# Patient Record
Sex: Female | Born: 1955 | Race: White | Hispanic: No | Marital: Married | Smoking: Never smoker
Health system: Southern US, Community
[De-identification: ages and names within clinical notes are randomized; demographics above are authoritative.]

## PROBLEM LIST (undated history)

## (undated) DIAGNOSIS — Z780 Asymptomatic menopausal state: Secondary | ICD-10-CM

## (undated) DIAGNOSIS — M549 Dorsalgia, unspecified: Secondary | ICD-10-CM

## (undated) DIAGNOSIS — K219 Gastro-esophageal reflux disease without esophagitis: Secondary | ICD-10-CM

## (undated) DIAGNOSIS — M542 Cervicalgia: Secondary | ICD-10-CM

## (undated) HISTORY — PX: BACK SURGERY: SHX140

## (undated) HISTORY — DX: Asymptomatic menopausal state: Z78.0

## (undated) HISTORY — DX: Dorsalgia, unspecified: M54.9

## (undated) HISTORY — PX: BREAST EXCISIONAL BIOPSY: SUR124

## (undated) HISTORY — DX: Gastro-esophageal reflux disease without esophagitis: K21.9

## (undated) HISTORY — PX: DG 4TH DIGIT  LEFT HAND: HXRAD1648

## (undated) HISTORY — PX: POSTERIOR CERVICAL FUSION/FORAMINOTOMY: SHX5038

## (undated) HISTORY — DX: Cervicalgia: M54.2

---

## 1991-11-09 HISTORY — PX: BREAST SURGERY: SHX581

## 1994-11-08 HISTORY — PX: UMBILICAL HERNIA REPAIR: SHX196

## 1997-11-08 HISTORY — PX: BACK SURGERY: SHX140

## 2007-11-09 HISTORY — PX: ENDOMETRIAL ABLATION: SHX621

## 2014-03-13 ENCOUNTER — Encounter: Payer: Self-pay | Admitting: Internal Medicine

## 2014-03-13 ENCOUNTER — Ambulatory Visit (INDEPENDENT_AMBULATORY_CARE_PROVIDER_SITE_OTHER): Payer: 59 | Admitting: Internal Medicine

## 2014-03-13 VITALS — BP 116/70 | HR 67 | Temp 98.1°F | Resp 18 | Wt 186.0 lb

## 2014-03-13 DIAGNOSIS — Z780 Asymptomatic menopausal state: Secondary | ICD-10-CM

## 2014-03-13 DIAGNOSIS — M5416 Radiculopathy, lumbar region: Secondary | ICD-10-CM

## 2014-03-13 DIAGNOSIS — N951 Menopausal and female climacteric states: Secondary | ICD-10-CM

## 2014-03-13 DIAGNOSIS — M5410 Radiculopathy, site unspecified: Secondary | ICD-10-CM

## 2014-03-13 DIAGNOSIS — N840 Polyp of corpus uteri: Secondary | ICD-10-CM

## 2014-03-13 DIAGNOSIS — G894 Chronic pain syndrome: Secondary | ICD-10-CM

## 2014-03-13 DIAGNOSIS — IMO0002 Reserved for concepts with insufficient information to code with codable children: Secondary | ICD-10-CM

## 2014-03-13 DIAGNOSIS — Z803 Family history of malignant neoplasm of breast: Secondary | ICD-10-CM

## 2014-03-13 DIAGNOSIS — K219 Gastro-esophageal reflux disease without esophagitis: Secondary | ICD-10-CM

## 2014-03-13 DIAGNOSIS — M5412 Radiculopathy, cervical region: Secondary | ICD-10-CM

## 2014-03-13 DIAGNOSIS — Z8041 Family history of malignant neoplasm of ovary: Secondary | ICD-10-CM

## 2014-03-13 DIAGNOSIS — D242 Benign neoplasm of left breast: Secondary | ICD-10-CM

## 2014-03-13 DIAGNOSIS — N952 Postmenopausal atrophic vaginitis: Secondary | ICD-10-CM

## 2014-03-13 DIAGNOSIS — M4712 Other spondylosis with myelopathy, cervical region: Secondary | ICD-10-CM

## 2014-03-13 DIAGNOSIS — N6009 Solitary cyst of unspecified breast: Secondary | ICD-10-CM

## 2014-03-13 DIAGNOSIS — Z808 Family history of malignant neoplasm of other organs or systems: Secondary | ICD-10-CM

## 2014-03-13 DIAGNOSIS — D249 Benign neoplasm of unspecified breast: Secondary | ICD-10-CM

## 2014-03-13 MED ORDER — ZOLPIDEM TARTRATE 5 MG PO TABS: 5.0000 mg | ORAL_TABLET | Freq: Every evening | ORAL | Status: DC | PRN

## 2014-03-13 NOTE — Patient Instructions (Signed)
Schedule CPE  Will rfer to Dr. Loretta Plume  Schedule 3d mm at [facility] hospital

## 2014-03-13 NOTE — Progress Notes (Signed)
Subjective:    Patient ID: Kristen Durham, female    DOB: 07/06/1956, 58 y.o.   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  Her husband took a job as a school principal.    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  She was under the care of [facility] Dr. Valma Cava.    Per neurology records she does have weakness of her  LUE ,  Has chronic pain  (managed currently without narcotics) and intermittant and recurrent  myelopathic and radiculopathic symptoms.     She describes that initial injury  Was in 1999 that she believes may have been sustained when she finished a long surgical case.  She had  HNP and  underwent a C6-7 formainotomy and laminotomy.  At some point she became myelopathic with temporary LE paralysis.  She underwent discectomy and fusion of C6-7.   She has also had fusion of L5-S1.  Other PMH includes a thickened endometrium,   S/P cryotherapy of endometrial polyps  In 2009 , GERD on Pepcid and abnormal MM Felt to be due to fibrocystic disease    FH premenopausal breast CA in [place]  She has discomfort at night but currently is controlled without narcotics. She would like to establish with a neurologist here in [place].  Ambien helps with her sleep issues.  She has had a cyst in her Right breast that needs to be followed serially.    She uses Estrace vaginal creme for her atrophy.    No Known Allergies Past Medical History  Diagnosis Date  . GERD (gastroesophageal reflux disease)   . Menopause   . Back pain   . Neck pain    Past Surgical History  Procedure Laterality Date  . Back surgery  1999    cerival laminectomy  . Back surgery    . Posterior cervical fusion/foraminotomy    . Breast surgery      breast biopsy    History   Social History  . Marital Status: Married    Spouse Name: N/A   Number of Children: N/A  . Years of Education: N/A   Occupational History  . Not on file.   Social History Main Topics  . Smoking status: Never Smoker   . Smokeless tobacco: Never Used  . Alcohol Use: 1.2 oz/week    2 Glasses of wine per week     Comment: week  . Drug Use: No  . Sexual Activity: Yes    Birth Control/ Protection: Post-menopausal   Other Topics Concern  . Not on file   Social History Narrative  . No narrative on file   Family History  Problem Relation Age of Onset  . Cancer Maternal Grandmother 39    breast  . Cancer Father     skin cancer  . Hypertension Father    There are no active problems to display for this patient.  No current outpatient prescriptions on file prior to visit.   No current facility-administered medications on file prior to visit.       Review of Systems See HPI     Objective:   Physical Exam Physical Exam  Nursing note and vitals reviewed.  Constitutional: She is oriented to person, place, and time. She appears well-developed and well-nourished.  HENT:  Head: Normocephalic  and atraumatic.  Cardiovascular: Normal rate and regular rhythm. Exam reveals no gallop and no friction rub.  No murmur heard.  Pulmonary/Chest: Breath sounds normal. She has no wheezes. She has no rales.  Neurological: She is alert and oriented to person, place, and time.  Skin: Skin is warm and dry.  Psychiatric: She has a normal mood and affect. Her behavior is normal.         Assessment & Plan:  Chronic  Cervical meyloradiculopaty and lumber radiculitis.  Pt wishes to establish with a neurologist here for purposes of ongoing management and disability concerns.    Baclofen, Alleve and Ambien help to control her symptoms  Chronic pain and insomnia:  On Baclofen, Alleve and Ambien which is controlling her symptoms now  GERD  Continue Pepcid  Endometrial polyp / vaginal atrophy/  S/P cryosurgery:    Ok for vaginal  Estrace 1 gm twice weekly.     Will need old records,  Schedule CPE  L breast fibroadenoma,  R breast cyst  Will schedule 3D mm    FH premenopausal Breast CA [place] age 64  Ovarian cancer great Aunt age 74's  FH skin ca basal cell father  Check all labs today.

## 2014-03-14 MED ORDER — FAMOTIDINE 40 MG PO TABS: 40.0000 mg | ORAL_TABLET | Freq: Every day | ORAL | Status: DC

## 2014-03-14 MED ORDER — BACLOFEN 10 MG PO TABS: 10.0000 mg | ORAL_TABLET | Freq: Two times a day (BID) | ORAL | Status: DC

## 2014-03-14 MED ORDER — NAPROXEN SODIUM 220 MG PO TABS: 220.0000 mg | ORAL_TABLET | Freq: Two times a day (BID) | ORAL | Status: DC

## 2014-03-17 DIAGNOSIS — M5412 Radiculopathy, cervical region: Secondary | ICD-10-CM | POA: Insufficient documentation

## 2014-03-17 DIAGNOSIS — Z808 Family history of malignant neoplasm of other organs or systems: Secondary | ICD-10-CM | POA: Insufficient documentation

## 2014-03-17 DIAGNOSIS — Z8041 Family history of malignant neoplasm of ovary: Secondary | ICD-10-CM | POA: Insufficient documentation

## 2014-03-17 DIAGNOSIS — M4712 Other spondylosis with myelopathy, cervical region: Secondary | ICD-10-CM | POA: Insufficient documentation

## 2014-03-17 DIAGNOSIS — M5416 Radiculopathy, lumbar region: Secondary | ICD-10-CM | POA: Insufficient documentation

## 2014-03-17 DIAGNOSIS — G894 Chronic pain syndrome: Secondary | ICD-10-CM | POA: Insufficient documentation

## 2014-03-17 DIAGNOSIS — D242 Benign neoplasm of left breast: Secondary | ICD-10-CM | POA: Insufficient documentation

## 2014-03-17 DIAGNOSIS — N952 Postmenopausal atrophic vaginitis: Secondary | ICD-10-CM | POA: Insufficient documentation

## 2014-03-17 DIAGNOSIS — N951 Menopausal and female climacteric states: Secondary | ICD-10-CM | POA: Insufficient documentation

## 2014-03-17 DIAGNOSIS — K219 Gastro-esophageal reflux disease without esophagitis: Secondary | ICD-10-CM | POA: Insufficient documentation

## 2014-03-17 DIAGNOSIS — N840 Polyp of corpus uteri: Secondary | ICD-10-CM | POA: Insufficient documentation

## 2014-03-17 DIAGNOSIS — N6009 Solitary cyst of unspecified breast: Secondary | ICD-10-CM | POA: Insufficient documentation

## 2014-03-17 DIAGNOSIS — Z803 Family history of malignant neoplasm of breast: Secondary | ICD-10-CM | POA: Insufficient documentation

## 2014-03-17 MED ORDER — ESTRADIOL 0.1 MG/GM VA CREA: TOPICAL_CREAM | VAGINAL | Status: DC

## 2014-03-20 ENCOUNTER — Ambulatory Visit: Payer: Self-pay | Admitting: Internal Medicine

## 2014-04-08 ENCOUNTER — Ambulatory Visit (HOSPITAL_COMMUNITY)
Admission: RE | Admit: 2014-04-08 | Discharge: 2014-04-08 | Disposition: A | Discharge status: Home / Self Care | Payer: 59 | Source: Ambulatory Visit | Attending: Internal Medicine | Admitting: Internal Medicine

## 2014-04-08 DIAGNOSIS — Z1231 Encounter for screening mammogram for malignant neoplasm of breast: Secondary | ICD-10-CM | POA: Insufficient documentation

## 2014-04-09 ENCOUNTER — Telehealth: Payer: Self-pay | Admitting: *Deleted

## 2014-04-09 ENCOUNTER — Ambulatory Visit (INDEPENDENT_AMBULATORY_CARE_PROVIDER_SITE_OTHER): Payer: 59 | Admitting: Neurology

## 2014-04-09 ENCOUNTER — Encounter: Payer: Self-pay | Admitting: Neurology

## 2014-04-09 VITALS — BP 114/76 | HR 60 | Resp 16 | Ht 67.0 in | Wt 187.0 lb

## 2014-04-09 DIAGNOSIS — M4712 Other spondylosis with myelopathy, cervical region: Secondary | ICD-10-CM

## 2014-04-09 DIAGNOSIS — M5416 Radiculopathy, lumbar region: Secondary | ICD-10-CM

## 2014-04-09 DIAGNOSIS — IMO0002 Reserved for concepts with insufficient information to code with codable children: Secondary | ICD-10-CM

## 2014-04-09 DIAGNOSIS — M5412 Radiculopathy, cervical region: Secondary | ICD-10-CM

## 2014-04-09 MED ORDER — BACLOFEN 10 MG PO TABS: 10.0000 mg | ORAL_TABLET | Freq: Two times a day (BID) | ORAL | Status: DC

## 2014-04-09 MED ORDER — ZOLPIDEM TARTRATE 5 MG PO TABS: 5.0000 mg | ORAL_TABLET | Freq: Every evening | ORAL | Status: DC | PRN

## 2014-04-09 NOTE — Progress Notes (Signed)
NEUROLOGY CONSULTATION NOTE  Kristen Durham MRN: [id number] DOB: 01-Feb-1956  Referring provider: Dr. Coralyn Mark Primary care provider: Dr. 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  Records personally reviewed.  She recently moved with her husband down to [place] from [place].  In May 1999, she began experiencing left shoulder pain with left upper extremity numbness and weakness. On 03/30/98, an MRI of the cervical spine revealed a large superiorly extruded cervical disc at C6-7 compress the cervical cord and C7 radiculopathy, small disc protrusion at C2-3 and C5-6. She was evaluated by an orthopedic surgeon who scheduled her for a left C6-7 foraminotomy and laminotomy on 04-02-98. The day before the surgery, she developed bilateral leg weakness, difficulty walking, and urinary retention. Steroids were held as she was going to have the surgery the following day anyway. She continued to have profound weakness of the lower extremities. An MRI of the cervical spine performed on 04/04/98 showed persistent central and left paramedial disc herniation at C6-7, with possible increased cord compression. Neurosurgery was consulted and she underwent C6-7 anteriorcervical discectomy, fusion and instrumentation.  She underwent PT and was able to start ambulating.  She return to work, only attending to nonsurgical clinic visits in August.  In May of 2000, she began having right leg weakness and numbness, as well as low back pain. MRI of the cervical spine performed on 04/08/99 showed small disc protrusion at C5-6 above C6-7 fusion, with scarring along the posterior margin of  the C6-7 left of midline. MRI of the lumbar spine performed 04/13/99 showed degenerative disc disease at L5-S1 with grade 1 spondylolisthesis of L5 on S1, bilateral L5 spondylolyses. In the summer of 2000, she began having right arm weakness numbness and severe pain. On 10/12/99, she was admitted for IV steroids. MRI of the cervical spine showed larger disc protrusion at C5-6 to the right side, slightly more prominent left-sided disc protrusion at C2-3, with scarring on the left side of C6-7 level. On 10/20/99, she underwent a C5-6 anterior cervical discectomy and fusion, anterior instrumentation C5-7, and removal of the previous anterior plate. In 2001, she had worsening of right leg weakness and numbness, with foot drop. On 04/12/00, she underwent laminectomy of L5 with bilateral L5-S1 facetectomies and foraminotomies with interbody carbon fiber cage fusion of L5-S1 with pedicle screws and plates from L8-X2 with left iliac crest bone graft harvest. She later got a second opinion by another neurosurgeon in August 2002, who believed that the lumbar failed to fuse. A PanMyelogram was performed, which she was told revealed arachnoiditis.  She was referred to another neurosurgeon in [place] in 2003. MVC-EMG was performed on 03/01/02, which revealed chronic right L4 radiculopathy and chronic left L4-5 radiculopathy. He recommended an anterior abdominal surgery to fuse the lumbar L4-S1 area. 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  Prior therapies tried:   hard back brace, bone stimulator which failed to fuse the lumbar region, TENS unit (caused increased discomfort), physical therapy (aggravated it) Prior medications:  baclofen 10mg  three times daily, Skelaxin 800mg , prednisone taper, Vioxx (GI upset), ibuprofen.  She has used narcotics in the past, but chooses to avoid them at this  time. Current medications include:  baclofen 10mg  twice daily,  naproxen sodium (Anaprox) and Ambien 5mg  as needed..   She is on permanent disability as she cannot work.  She continues to experience pain in the neck aggravated by any movement.  She has significant limited neck movement.  There is no radicular pain down the arms.  Symptoms are worse at night and she often requires Ambien 5mg .  She experiences intermittent numbness of the right leg with any movement as well as discomfort in the feet and legs causing difficulty falling asleep at night.  During the day, she has to constantly change position.  She is able to drive, usually on cruise control since she has difficulty operating with her right foot.  She is able to perform light housework but cannot partake in any moderate or heavy lifting.  Raising her arms causes aggravation of neck and shoulder pain.  She is able to write with a pen briefly.  She is able to use utensils.  She usually will drop items.  She has fallen down on occasion, particularly on the stairs.  She feels it is because her right leg will give out.  If she washes her hair in the shower with her eyes closed, she will become off-balance.  She frequently has posterior headache radiating to the front and triggered by neck movement.  She still has mild urinary retention and overflow in the mornings.  She occasionally has constipation but no incontinence.    PAST MEDICAL HISTORY: Past Medical History  Diagnosis Date  . GERD (gastroesophageal reflux disease)   . Menopause   . Back pain   . Neck pain     PAST SURGICAL HISTORY: Past Surgical History  Procedure Laterality Date  . Back surgery  1999    cerival laminectomy  . Back surgery      x2  . Posterior cervical fusion/foraminotomy    . Breast surgery      breast biopsy     MEDICATIONS: Current Outpatient Prescriptions on File Prior to Visit  Medication Sig Dispense Refill  . baclofen (LIORESAL) 10 MG tablet Take 1 tablet (10 mg total) by mouth 2 (two) times daily.  180 each  0    . estradiol (ESTRACE VAGINAL) 0.1 MG/GM vaginal cream Insert one gram vaginally two times per week  42.5 g  2  . famotidine (PEPCID) 40 MG tablet Take 1 tablet (40 mg total) by mouth daily.  90 tablet  1  . MULTIPLE VITAMIN PO Take 1 tablet by mouth daily.      . naproxen sodium (ANAPROX) 220 MG tablet Take 1 tablet (220 mg total) by mouth 2 (two) times daily with a meal.  180 tablet  0  . Vitamin D, Ergocalciferol, (DRISDOL) 50000 UNITS CAPS capsule Take 50,000 Units by mouth every 7 (seven) days.      Marland Kitchen zolpidem (AMBIEN) 5 MG tablet Take 1 tablet (5 mg total) by mouth at bedtime as needed for sleep.  30 tablet  1   No current facility-administered medications on file prior to visit.    ALLERGIES: No Known Allergies  FAMILY HISTORY: Family History  Problem Relation Age of Onset  . Cancer Maternal Grandmother 57    breast  . Cancer Father     skin cancer  . Hypertension Father     SOCIAL HISTORY: History   Social History  . Marital Status: Married    Spouse Name: N/A    Number of Children: N/A  .  Years of Education: N/A   Occupational History  . Not on file.   Social History Main Topics  . Smoking status: Never Smoker   . Smokeless tobacco: Never Used  . Alcohol Use: 1.2 oz/week    2 Glasses of wine per week     Comment: week  . Drug Use: No  . Sexual Activity: Yes    Birth Control/ Protection: Post-menopausal   Other Topics Concern  . Not on file   Social History Narrative  . No narrative on file    REVIEW OF SYSTEMS: Constitutional: No fevers, chills, or sweats, no generalized fatigue, change in appetite Eyes: No visual changes, double vision, eye pain Ear, nose and throat: No hearing loss, ear pain, nasal congestion, sore throat Cardiovascular: No chest pain, palpitations Respiratory:  No shortness of breath at rest or with exertion, wheezes GastrointestinaI: No nausea, vomiting, diarrhea, abdominal pain, fecal incontinence Genitourinary:  No dysuria,  urinary retention or frequency Musculoskeletal:  No neck pain, back pain Integumentary: No rash, pruritus, skin lesions Neurological: as above Psychiatric: No depression, insomnia, anxiety Endocrine: No palpitations, fatigue, diaphoresis, mood swings, change in appetite, change in weight, increased thirst Hematologic/Lymphatic:  No anemia, purpura, petechiae. Allergic/Immunologic: no itchy/runny eyes, nasal congestion, recent allergic reactions, rashes  PHYSICAL EXAM: Filed Vitals:   04/09/14 0917  BP: 114/76  Pulse: 60  Resp: 16   General: No acute distress Head:  Normocephalic/atraumatic Neck: supple, no paraspinal tenderness, full range of motion Back: No paraspinal tenderness Heart: regular rate and rhythm Lungs: Clear to auscultation bilaterally. Vascular: No carotid bruits. Neurological Exam: Mental status: alert and oriented to person, place, and time, recent and remote memory intact, fund of knowledge intact, attention and concentration intact, speech fluent and not dysarthric, language intact. Cranial nerves: CN I: not tested CN II: pupils equal, round and reactive to light, visual fields intact, fundi unremarkable, without vessel changes, exudates, hemorrhages or papilledema. CN III, IV, VI:  full range of motion, no nystagmus, no ptosis CN V: facial sensation intact CN VII: upper and lower face symmetric CN VIII: hearing intact CN IX, X: gag intact, uvula midline CN XI: sternocleidomastoid and trapezius muscles intact CN XII: tongue midline Bulk & Tone: normal, no fasciculations.   Motor: 4+/5 left triceps, left adductor minimi, and right quadriceps.  Otherwise 5/5 throughout. Sensation: Reduced pinprick over dorsum of right foot and medial right lower leg.  Reduced vibration in right upper extremity. Deep Tendon Reflexes: 1+ and symmetric in upper extremities, 2+ and symmetric in lower extremities, Hoffman sign absent, toes downgoing Finger to nose testing: no  dysmetria Heel to shin: no dysmetria Gait: mildly spastic with slight adduction of the lower extremities.  Able to walk on toes and heels.  Able to tandem walk. Romberg positive.  IMPRESSION: Cervical myeloradiculopathy and lumbar radiculopathy with chronic pain and permanent disability.  PLAN: 1.  Will continue baclofen 10mg  three times daily and Anaprox. 2.  Ambien 5mg  at bedtime as needed. 3.  Recommend light walking or swimming 4.  Will fill out disability forms 5.  Follow up in 6 months or as needed.  Thank you for allowing me to take part in the care of this patient.  Metta Clines, DO  CC:  Emi Belfast, MD

## 2014-04-09 NOTE — Patient Instructions (Signed)
1.  Will send in refills for the baclofen and Ambien 2.  Try swimming.  It's okay to go for walks but not to overdo it. 3.  Follow up in 6 months or as needed. 4.  Will contact you after I review notes.

## 2014-04-18 ENCOUNTER — Ambulatory Visit (INDEPENDENT_AMBULATORY_CARE_PROVIDER_SITE_OTHER): Payer: 59 | Admitting: Internal Medicine

## 2014-04-18 ENCOUNTER — Encounter: Payer: Self-pay | Admitting: Internal Medicine

## 2014-04-18 VITALS — BP 117/78 | HR 91 | Temp 98.2°F | Resp 18 | Wt 183.0 lb

## 2014-04-18 DIAGNOSIS — R05 Cough: Secondary | ICD-10-CM

## 2014-04-18 DIAGNOSIS — R059 Cough, unspecified: Secondary | ICD-10-CM

## 2014-04-18 DIAGNOSIS — R509 Fever, unspecified: Secondary | ICD-10-CM

## 2014-04-18 DIAGNOSIS — J020 Streptococcal pharyngitis: Secondary | ICD-10-CM

## 2014-04-18 LAB — POCT RAPID STREP A (OFFICE): Rapid Strep A Screen: POSITIVE — AB

## 2014-04-18 LAB — POCT INFLUENZA A/B: Influenza A, POC: NEGATIVE

## 2014-04-18 MED ORDER — AZITHROMYCIN 250 MG PO TABS: ORAL_TABLET | ORAL | Status: DC

## 2014-04-18 MED ORDER — HYDROCOD POLST-CHLORPHEN POLST 10-8 MG/5ML PO LQCR: 5.0000 mL | Freq: Two times a day (BID) | ORAL | Status: DC | PRN

## 2014-04-18 NOTE — Patient Instructions (Signed)
See me if not better 

## 2014-04-18 NOTE — Progress Notes (Signed)
Subjective:    Patient ID: Kristen Durham, female    DOB: [birth year], 58 y.o.   MRN: [id number]  HPI  Kristen Durham is here for acute visit.   Several days of sore throat, subjective fever and cough after traveling to [facility] to visit daughter.     No wheezing no SOB  No Known Allergies Past Medical History  Diagnosis Date  . GERD (gastroesophageal reflux disease)   . Menopause   . Back pain   . Neck pain    Past Surgical History  Procedure Laterality Date  . Back surgery  1999    cerival laminectomy  . Back surgery      x2  . Posterior cervical fusion/foraminotomy    . Breast surgery      breast biopsy    History   Social History  . Marital Status: Married    Spouse Name: N/A    Number of Children: N/A  . Years of Education: N/A   Occupational History  . Not on file.   Social History Main Topics  . Smoking status: Never Smoker   . Smokeless tobacco: Never Used  . Alcohol Use: 1.2 oz/week    2 Glasses of wine per week     Comment: week  . Drug Use: No  . Sexual Activity: Yes    Birth Control/ Protection: Post-menopausal   Other Topics Concern  . Not on file   Social History Narrative  . No narrative on file   Family History  Problem Relation Age of Onset  . Cancer Maternal Grandmother 63    breast  . Cancer Father     skin cancer  . Hypertension Father    Patient Active Problem List   Diagnosis Date Noted  . Cervical myelopathy with cervical radiculopathy 03/17/2014  . Lumbar radiculopathy, chronic 03/17/2014  . Fibroadenoma of left breast  breast BX 1996 03/17/2014  .  Endometrail polyp cryotherapy 2009 03/17/2014  . GERD (gastroesophageal reflux disease) 03/17/2014  . Menopausal hot flushes 03/17/2014  . Chronic pain syndrome 03/17/2014  . Vaginal atrophy 03/17/2014  . Cyst of breast, solitary 03/17/2014  . Family history of breast cancer  Grandmother age 16 03/17/2014  . FH: ovarian cancer  great aunt 03/17/2014  .  Family history of basal cell  skin ca father 03/17/2014   Current Outpatient Prescriptions on File Prior to Visit  Medication Sig Dispense Refill  . baclofen (LIORESAL) 10 MG tablet Take 1 tablet (10 mg total) by mouth 2 (two) times daily.  180 each  3  . estradiol (ESTRACE VAGINAL) 0.1 MG/GM vaginal cream Insert one gram vaginally two times per week  42.5 g  2  . famotidine (PEPCID) 40 MG tablet Take 1 tablet (40 mg total) by mouth daily.  90 tablet  1  . MULTIPLE VITAMIN PO Take 1 tablet by mouth daily.      . naproxen sodium (ANAPROX) 220 MG tablet Take 1 tablet (220 mg total) by mouth 2 (two) times daily with a meal.  180 tablet  0  . Vitamin D, Ergocalciferol, (DRISDOL) 50000 UNITS CAPS capsule Take 50,000 Units by mouth every 7 (seven) days.      Marland Kitchen zolpidem (AMBIEN) 5 MG tablet Take 1 tablet (5 mg total) by mouth at bedtime as needed for sleep.  90 tablet  3   No current facility-administered medications on file prior to visit.       Review of Systems    see HPI Objective:  Physical Exam Physical Exam  Constitutional: She is oriented to person, place, and time. She appears well-developed and well-nourished. She is cooperative.  HENT:  Head: Normocephalic and atraumatic.  Right Ear: A middle ear effusion is present.  Left Ear: A middle ear effusion is present.  Nose: Mucosal edema present.  Mouth/Throat: Oropharyngeal exudate and posterior oropharyngeal erythema present.  Serous effusion bilaterally  Eyes: Conjunctivae and EOM are normal. Pupils are equal, round, and reactive to light.  Neck: Neck supple. Carotid bruit is not present. No mass present.  Cardiovascular: Regular rhythm, normal heart sounds, intact distal pulses and normal pulses. Exam reveals no gallop and no friction rub.  No murmur heard.  Pulmonary/Chest: Breath sounds normal. She has no wheezes. She has no rhonchi. She has no rales.  Lymphadenopathy:  She has cervical adenopathy.  Neurological: She is alert and oriented to person,  place, and time.  Skin: Skin is warm and dry. No abrasion, no bruising, no ecchymosis and no rash noted. No cyanosis. Nails show no clubbing.  Psychiatric: She has a normal mood and affect. Her speech is normal and behavior is normal.               Assessment & Plan:  Strep pharyngitis  Strep pos.    Will give 500 mg Rocephin in office  Z-pak  Cough  tussionex  q12h  Prn  See me if not better

## 2014-04-19 ENCOUNTER — Telehealth: Payer: Self-pay | Admitting: Neurology

## 2014-04-19 NOTE — Telephone Encounter (Signed)
Pt called/returning your call. Pt stated hat she has to fax another release to obtain the records and she will fax another release.

## 2014-04-19 NOTE — Telephone Encounter (Signed)
Pt is returning your call please call [phone]

## 2014-04-19 NOTE — Telephone Encounter (Signed)
error 

## 2014-04-19 NOTE — Telephone Encounter (Signed)
Pt called stating that she will fax the release of records form.

## 2014-04-21 ENCOUNTER — Encounter: Payer: Self-pay | Admitting: Internal Medicine

## 2014-04-21 DIAGNOSIS — R87619 Unspecified abnormal cytological findings in specimens from cervix uteri: Secondary | ICD-10-CM | POA: Insufficient documentation

## 2014-04-22 ENCOUNTER — Encounter: Payer: Self-pay | Admitting: *Deleted

## 2014-04-23 NOTE — Telephone Encounter (Signed)
Forms mailed to patient

## 2014-08-27 ENCOUNTER — Other Ambulatory Visit: Payer: Self-pay | Admitting: *Deleted

## 2014-08-27 DIAGNOSIS — E559 Vitamin D deficiency, unspecified: Secondary | ICD-10-CM

## 2014-08-27 DIAGNOSIS — Z0000 Encounter for general adult medical examination without abnormal findings: Secondary | ICD-10-CM

## 2014-08-27 LAB — COMPREHENSIVE METABOLIC PANEL
ALT: 15 U/L (ref 0–35)
AST: 16 U/L (ref 0–37)
Albumin: 4.1 g/dL (ref 3.5–5.2)
Alkaline Phosphatase: 53 U/L (ref 39–117)
BUN: 13 mg/dL (ref 6–23)
CALCIUM: 9.2 mg/dL (ref 8.4–10.5)
CO2: 26 meq/L (ref 19–32)
CREATININE: 0.8 mg/dL (ref 0.50–1.10)
Chloride: 105 mEq/L (ref 96–112)
Glucose, Bld: 89 mg/dL (ref 70–99)
Potassium: 4.4 mEq/L (ref 3.5–5.3)
Sodium: 139 mEq/L (ref 135–145)
Total Bilirubin: 0.4 mg/dL (ref 0.2–1.2)
Total Protein: 6.3 g/dL (ref 6.0–8.3)

## 2014-08-27 LAB — CBC WITH DIFFERENTIAL/PLATELET
BASOS ABS: 0 10*3/uL (ref 0.0–0.1)
Basophils Relative: 0 % (ref 0–1)
EOS PCT: 1 % (ref 0–5)
Eosinophils Absolute: 0 10*3/uL (ref 0.0–0.7)
HCT: 36.8 % (ref 36.0–46.0)
Hemoglobin: 12.1 g/dL (ref 12.0–15.0)
Lymphocytes Relative: 27 % (ref 12–46)
Lymphs Abs: 1.3 10*3/uL (ref 0.7–4.0)
MCH: 29.1 pg (ref 26.0–34.0)
MCHC: 32.9 g/dL (ref 30.0–36.0)
MCV: 88.5 fL (ref 78.0–100.0)
MONO ABS: 0.3 10*3/uL (ref 0.1–1.0)
Monocytes Relative: 7 % (ref 3–12)
Neutro Abs: 3.1 10*3/uL (ref 1.7–7.7)
Neutrophils Relative %: 65 % (ref 43–77)
Platelets: 271 10*3/uL (ref 150–400)
RBC: 4.16 MIL/uL (ref 3.87–5.11)
RDW: 13.6 % (ref 11.5–15.5)
WBC: 4.7 10*3/uL (ref 4.0–10.5)

## 2014-08-27 LAB — LIPID PANEL
CHOL/HDL RATIO: 3.3 ratio
CHOLESTEROL: 202 mg/dL — AB (ref 0–200)
HDL: 61 mg/dL (ref >39)
LDL Cholesterol: 107 mg/dL — ABNORMAL HIGH (ref 0–99)
Triglycerides: 169 mg/dL — ABNORMAL HIGH (ref <150)
VLDL: 34 mg/dL (ref 0–40)

## 2014-08-27 LAB — TSH: TSH: 1.734 u[IU]/mL (ref 0.350–4.500)

## 2014-08-28 ENCOUNTER — Encounter: Payer: Self-pay | Admitting: Internal Medicine

## 2014-08-28 ENCOUNTER — Other Ambulatory Visit: Payer: Self-pay

## 2014-08-28 ENCOUNTER — Other Ambulatory Visit: Payer: Self-pay | Admitting: Internal Medicine

## 2014-08-28 ENCOUNTER — Ambulatory Visit (INDEPENDENT_AMBULATORY_CARE_PROVIDER_SITE_OTHER): Payer: 59 | Admitting: Internal Medicine

## 2014-08-28 VITALS — BP 100/65 | HR 67 | Temp 98.2°F | Resp 16 | Ht 65.0 in | Wt 185.0 lb

## 2014-08-28 DIAGNOSIS — M4712 Other spondylosis with myelopathy, cervical region: Secondary | ICD-10-CM

## 2014-08-28 DIAGNOSIS — K2100 Gastro-esophageal reflux disease with esophagitis, without bleeding: Secondary | ICD-10-CM

## 2014-08-28 DIAGNOSIS — M5416 Radiculopathy, lumbar region: Secondary | ICD-10-CM

## 2014-08-28 DIAGNOSIS — Z0189 Encounter for other specified special examinations: Secondary | ICD-10-CM

## 2014-08-28 DIAGNOSIS — N6323 Unspecified lump in the left breast, lower outer quadrant: Secondary | ICD-10-CM

## 2014-08-28 DIAGNOSIS — M5412 Radiculopathy, cervical region: Secondary | ICD-10-CM

## 2014-08-28 DIAGNOSIS — N952 Postmenopausal atrophic vaginitis: Secondary | ICD-10-CM

## 2014-08-28 DIAGNOSIS — Z0000 Encounter for general adult medical examination without abnormal findings: Secondary | ICD-10-CM

## 2014-08-28 DIAGNOSIS — Z23 Encounter for immunization: Secondary | ICD-10-CM

## 2014-08-28 DIAGNOSIS — Z1151 Encounter for screening for human papillomavirus (HPV): Secondary | ICD-10-CM

## 2014-08-28 DIAGNOSIS — G959 Disease of spinal cord, unspecified: Secondary | ICD-10-CM

## 2014-08-28 DIAGNOSIS — Z124 Encounter for screening for malignant neoplasm of cervix: Secondary | ICD-10-CM

## 2014-08-28 LAB — VITAMIN D 25 HYDROXY (VIT D DEFICIENCY, FRACTURES): Vit D, 25-Hydroxy: 40 ng/mL (ref 30–89)

## 2014-08-28 NOTE — Progress Notes (Signed)
Subjective:    Patient ID: Kristen Durham, female    DOB: [birth year], 58 y.o.   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  PLAN:  1. Will continue baclofen 10mg  three times daily and Anaprox.  2. Ambien 5mg  at bedtime as needed.  3. Recommend light walking or swimming  4. Will fill out disability forms  5. Follow up in 6 months or as needed.  Thank you for allowing me to take part in the care of this patient.  Metta Clines, DO   Today    Kristen Durham is here for CPE  HM:  MM done 04/2014,  [place]on[place] done 2010 in [place]  (no polyps  Due in 2020)  Kristen Durham is a non-smoker.   Needs TDAp today .  Had flu vaccine at husbands work .   [place]ap 2014 in [place] had cryosurgery in the past  Cervical/lumbar radiculoapthy    Doing well   Vaginal atrophy on Estrace   Fibroadenoma left breast  Last mm negative  No Known Allergies Past Medical History  Diagnosis Date  . GERD (gastroesophageal reflux disease)   . Menopause   . Back pain   . Neck pain    Past Surgical History  Procedure Laterality Date  . Back surgery  1999    cerival laminectomy  . Back surgery      x2  . Posterior cervical fusion/foraminotomy    . Breast surgery      breast biopsy    History   Social History  . Marital Status: Married    Spouse Name: N/A    Number of Children: N/A  . Years of Education: N/A   Occupational History  . Not on file.   Social History Main Topics  . Smoking status: Never Smoker   . Smokeless tobacco: Never Used  . Alcohol Use: 1.2 oz/week    2 Glasses of wine per week     Comment: week  . Drug Use: No  . Sexual Activity: Yes    Birth Control/ Protection: Post-menopausal   Other Topics Concern  . Not on file   Social History Narrative  . No narrative on file   Family History  Problem Relation Age of Onset  . Cancer Maternal Grandmother 44    breast  . Cancer Father    skin cancer  . Hypertension Father    Patient Active Problem List   Diagnosis Date Noted  . Abnormal Pap smear of cervix  S/P cryosurgical ablation 04/21/2014  . Cervical myelopathy with cervical radiculopathy 03/17/2014  . Lumbar radiculopathy, chronic 03/17/2014  . Fibroadenoma of left breast  breast BX 1996 03/17/2014  .  Endometrail polyp cryotherapy 2009 03/17/2014  . GERD (gastroesophageal reflux disease) 03/17/2014  . Menopausal hot flushes 03/17/2014  . Chronic pain syndrome 03/17/2014  . Vaginal atrophy 03/17/2014  . Cyst of breast, solitary 03/17/2014  . Family history of breast cancer  Grandmother age 43 03/17/2014  . FH: ovarian cancer  great aunt 03/17/2014  .  Family history of basal cell skin ca father 03/17/2014   Current Outpatient Prescriptions on File Prior to Visit  Medication Sig Dispense Refill  . azithromycin (ZITHROMAX) 250 MG tablet Take as directed  6 tablet  0  . baclofen (LIORESAL) 10 MG tablet Take 1 tablet (10 mg total) by mouth 2 (two) times daily.  180 each  3  . chlorpheniramine-HYDROcodone (TUSSIONEX PENNKINETIC ER) 10-8 MG/5ML  LQCR Take 5 mLs by mouth every 12 (twelve) hours as needed for cough.  240 mL  0  . estradiol (ESTRACE VAGINAL) 0.1 MG/GM vaginal cream Insert one gram vaginally two times per week  42.5 g  2  . famotidine (PEPCID) 40 MG tablet Take 1 tablet (40 mg total) by mouth daily.  90 tablet  1  . MULTIPLE VITAMIN PO Take 1 tablet by mouth daily.      . naproxen sodium (ANAPROX) 220 MG tablet Take 1 tablet (220 mg total) by mouth 2 (two) times daily with a meal.  180 tablet  0  . Vitamin D, Ergocalciferol, (DRISDOL) 50000 UNITS CAPS capsule Take 50,000 Units by mouth every 7 (seven) days.      Marland Kitchen zolpidem (AMBIEN) 5 MG tablet Take 1 tablet (5 mg total) by mouth at bedtime as needed for sleep.  90 tablet  3   No current facility-administered medications on file prior to visit.        Review of Systems  Respiratory: Negative for  choking, chest tightness, shortness of breath and wheezing.   Cardiovascular: Negative for chest pain, palpitations and leg swelling.  Gastrointestinal: Negative for abdominal pain.       Objective:   Physical Exam Physical Exam  Vital signs and nursing note reviewed  Constitutional: She is oriented to person, place, and time. She appears well-developed and well-nourished. She is cooperative.  HENT:  Head: Normocephalic and atraumatic.  Right Ear: Tympanic membrane normal.  Nose: Nose normal.  Mouth/Throat: Oropharynx is clear and moist and mucous membranes are normal. No oropharyngeal exudate or posterior oropharyngeal erythema.  Eyes: Conjunctivae and EOM are normal. Pupils are equal, round, and reactive to light.  Neck: Neck supple. No JVD present. Carotid bruit is not present. No mass and no thyromegaly present.  Cardiovascular: Regular rhythm, normal heart sounds, intact distal pulses and normal pulses.  Exam reveals no gallop and no friction rub.   No murmur heard. Pulses:      Dorsalis pedis pulses are 2+ on the right side, and 2+ on the left side.  Pulmonary/Chest: Breath sounds normal. She has no wheezes. She has no rhonchi. She has no rales. Right breast exhibits no mass, no nipple discharge and no skin change.  Left breast  Thickening near nipple . No axillary adenopathy no nipple discharge. Abdominal: Soft. Bowel sounds are normal. She exhibits no distension and no mass. There is no hepatosplenomegaly. There is no tenderness. There is no CVA tenderness.  Genitourinary: Rectum normal, vagina normal and uterus normal. Rectal exam shows no mass. Guaiac negative stool. No labial fusion. There is no lesion on the right labia. There is no lesion on the left labia. Cervix exhibits no motion tenderness. Right adnexum displays no mass, no tenderness and no fullness. Left adnexum displays no mass, no tenderness and no fullness. No erythema around the vagina.  Musculoskeletal:       No  active synovitis to any joint.    Lymphadenopathy:       Right cervical: No superficial cervical adenopathy present.      Left cervical: No superficial cervical adenopathy present.       Right axillary: No pectoral and no lateral adenopathy present.       Left axillary: No pectoral and no lateral adenopathy present.      Right: No inguinal adenopathy present.       Left: No inguinal adenopathy present.  Neurological: She is alert and oriented to person, place,  and time. She has normal strength and normal reflexes. No cranial nerve deficit or sensory deficit. She displays a negative Romberg sign. Coordination and gait normal.  Skin: Skin is warm and dry. No abrasion, no bruising, no ecchymosis and no rash noted. No cyanosis. Nails show no clubbing.  Psychiatric: She has a normal mood and affect. Her speech is normal and behavior is normal.          Assessment & Plan:   HM:  Advised Zostavax  Pt will check with insurance.    Tdap today   Pap today  Nonsmoker  Left breast mass will get diagnsotic mm  Vaginal atrophy/ H/O endometrial polyp  Continue Estrace  Chronic pain syndrome due to cervical and lumbar radiculopathy  GERD:  Continue PPI   Diverticulosis         Assessment & Plan:

## 2014-08-28 NOTE — Patient Instructions (Signed)
Will set up diagnostic mammogram left breast   See me as needed

## 2014-08-28 NOTE — Addendum Note (Signed)
Addended by: Susanne Greenhouse E on: 08/28/2014 01:40 PM   Modules accepted: Orders

## 2014-08-30 LAB — CYTOLOGY - PAP

## 2014-09-02 ENCOUNTER — Ambulatory Visit
Admission: RE | Admit: 2014-09-02 | Discharge: 2014-09-02 | Disposition: A | Discharge status: Home / Self Care | Payer: 59 | Source: Ambulatory Visit | Attending: Internal Medicine | Admitting: Internal Medicine

## 2014-09-02 DIAGNOSIS — N6323 Unspecified lump in the left breast, lower outer quadrant: Secondary | ICD-10-CM

## 2014-09-05 NOTE — Progress Notes (Signed)
I spoke with Kristen Durham and she is aware of her Breast U/S results-eh

## 2014-09-09 ENCOUNTER — Encounter: Payer: Self-pay | Admitting: Internal Medicine

## 2014-10-09 ENCOUNTER — Encounter: Payer: Self-pay | Admitting: Neurology

## 2014-10-09 ENCOUNTER — Ambulatory Visit (INDEPENDENT_AMBULATORY_CARE_PROVIDER_SITE_OTHER): Payer: 59 | Admitting: Neurology

## 2014-10-09 VITALS — BP 132/76 | HR 78 | Temp 98.1°F | Resp 16 | Wt 186.1 lb

## 2014-10-09 DIAGNOSIS — M5412 Radiculopathy, cervical region: Secondary | ICD-10-CM

## 2014-10-09 DIAGNOSIS — M4712 Other spondylosis with myelopathy, cervical region: Secondary | ICD-10-CM

## 2014-10-09 DIAGNOSIS — G959 Disease of spinal cord, unspecified: Secondary | ICD-10-CM

## 2014-10-09 DIAGNOSIS — M5416 Radiculopathy, lumbar region: Secondary | ICD-10-CM

## 2014-10-09 MED ORDER — GABAPENTIN 300 MG PO CAPS
300.0000 mg | ORAL_CAPSULE | Freq: Every day | ORAL | Status: DC
Start: 2014-10-09 — End: 2015-04-10

## 2014-10-09 NOTE — Progress Notes (Signed)
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  UPDATE: For muscle spasms and pain, she takes baclofen 10mg  at bedtime and Anaprox.  She also takes Ambien 5mg  at bedtime as needed to help with sleep.  She has had some increased pain and numbness in both legs below the knees.  It is particularly worse at night and exacerbated with walking.  She also notes increased pain and weakness in the right arm and leg.  HISTORY: In May 1999, she began experiencing left shoulder pain with left upper extremity numbness and weakness. On 03/30/98, an MRI of the cervical spine revealed a large superiorly extruded cervical disc at C6-7 compress the cervical cord and C7 radiculopathy, small disc protrusion at C2-3 and C5-6. She was evaluated by an orthopedic surgeon who scheduled her for a left C6-7 foraminotomy and laminotomy on 04-02-98. The day before the surgery, she developed bilateral leg weakness, difficulty walking, and urinary retention. Steroids were held as she was going to have the surgery the following day anyway. She continued to have profound weakness of the lower extremities. An MRI of the cervical spine performed on 04/04/98 showed persistent central and left paramedial disc herniation at C6-7, with possible increased cord compression. Neurosurgery was consulted and she underwent C6-7 anteriorcervical discectomy, fusion and instrumentation.  She underwent PT and was able to start ambulating.  She return to work, only attending to nonsurgical clinic visits in August.  In May of 2000, she began having right leg weakness and numbness, as well as low back pain. MRI of the cervical spine  performed on 04/08/99 showed small disc protrusion at C5-6 above C6-7 fusion, with scarring along the posterior margin of the C6-7 left of midline. MRI of the lumbar spine performed 04/13/99 showed degenerative disc disease at L5-S1 with grade 1 spondylolisthesis of L5 on S1, bilateral L5 spondylolyses. In the summer of 2000, she began having right arm weakness numbness and severe pain. On 10/12/99, she was admitted for IV steroids. MRI of the cervical spine showed larger disc protrusion at C5-6 to the right side, slightly more prominent left-sided disc protrusion at C2-3, with scarring on the left side of C6-7 level. On 10/20/99, she underwent a C5-6 anterior cervical discectomy and fusion, anterior instrumentation C5-7, and removal of the previous anterior plate. In 2001, she had worsening of right leg weakness and numbness, with foot drop. On 04/12/00, she underwent laminectomy of L5 with bilateral L5-S1 facetectomies and foraminotomies with interbody carbon fiber cage fusion of L5-S1 with pedicle screws and plates from D3-T7 with left iliac crest bone graft harvest. She later got a second opinion by another neurosurgeon in August 2002, who believed that the lumbar failed to fuse. A PanMyelogram was performed, which she was told revealed arachnoiditis.  She was referred to another neurosurgeon in [place] in 2003. MVC-EMG was performed on 03/01/02, which revealed chronic right L4 radiculopathy and chronic left L4-5 radiculopathy. He recommended an anterior abdominal surgery to fuse the lumbar L4-S1 area. 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  Prior therapies tried:   hard back brace, bone stimulator which failed to fuse the lumbar region, TENS unit (caused increased discomfort),  physical therapy (aggravated it) Prior medications:  baclofen 10mg  three times daily, Skelaxin 800mg , prednisone taper, Vioxx (GI upset), ibuprofen.  She has used  narcotics in the past, but chooses to avoid them at this time. Current medications include:  baclofen 10mg  twice daily, naproxen sodium (Anaprox) and Ambien 5mg  as needed..   She is on permanent disability as she cannot work.  She continues to experience pain in the neck aggravated by any movement.  She has significant limited neck movement.  There is no radicular pain down the arms.  Symptoms are worse at night and she often requires Ambien 5mg .  She experiences intermittent numbness of the right leg with any movement as well as discomfort in the feet and legs causing difficulty falling asleep at night.  During the day, she has to constantly change position.  She is able to drive, usually on cruise control since she has difficulty operating with her right foot.  She is able to perform light housework but cannot partake in any moderate or heavy lifting.  Raising her arms causes aggravation of neck and shoulder pain.  She is able to write with a pen briefly.  She is able to use utensils.  She usually will drop items.  She has fallen down on occasion, particularly on the stairs.  She feels it is because her right leg will give out.  If she washes her hair in the shower with her eyes closed, she will become off-balance.  She frequently has posterior headache radiating to the front and triggered by neck movement.  She still has mild urinary retention and overflow in the mornings.  She occasionally has constipation but no incontinence.   PAST MEDICAL HISTORY: Past Medical History  Diagnosis Date  . GERD (gastroesophageal reflux disease)   . Menopause   . Back pain   . Neck pain     MEDICATIONS: Current Outpatient Prescriptions on File Prior to Visit  Medication Sig Dispense Refill  . baclofen (LIORESAL) 10 MG tablet Take 10 mg by mouth 2 (two) times daily.    Marland Kitchen estradiol (ESTRACE VAGINAL) 0.1 MG/GM vaginal cream Insert one gram vaginally two times per week 42.5 g 2  . famotidine (PEPCID) 40 MG tablet  Take 1 tablet (40 mg total) by mouth daily. 90 tablet 1  . MULTIPLE VITAMIN PO Take 1 tablet by mouth daily.    . naproxen sodium (ANAPROX) 220 MG tablet Take 1 tablet (220 mg total) by mouth 2 (two) times daily with a meal. 180 tablet 0  . Vitamin D, Ergocalciferol, (DRISDOL) 50000 UNITS CAPS capsule Take 50,000 Units by mouth every 7 (seven) days.    Marland Kitchen zolpidem (AMBIEN) 5 MG tablet Take 1 tablet (5 mg total) by mouth at bedtime as needed for sleep. 90 tablet 3   No current facility-administered medications on file prior to visit.    ALLERGIES: No Known Allergies  FAMILY HISTORY: Family History  Problem Relation Age of Onset  . Cancer Maternal Grandmother 6    breast  . Cancer Father     skin cancer  . Hypertension Father     SOCIAL HISTORY: History   Social History  . Marital Status: Married    Spouse Name: N/A    Number of Children: N/A  . Years of Education: N/A   Occupational History  . Not on file.   Social History Main Topics  . Smoking status: Never Smoker   . Smokeless tobacco: Never Used  . Alcohol Use: 1.2 oz/week  2 Glasses of wine per week     Comment: week  . Drug Use: No  . Sexual Activity: Yes    Birth Control/ Protection: Post-menopausal   Other Topics Concern  . Not on file   Social History Narrative    REVIEW OF SYSTEMS: Constitutional: No fevers, chills, or sweats, no generalized fatigue, change in appetite Eyes: No visual changes, double vision, eye pain Ear, nose and throat: No hearing loss, ear pain, nasal congestion, sore throat Cardiovascular: No chest pain, palpitations Respiratory:  No shortness of breath at rest or with exertion, wheezes GastrointestinaI: No nausea, vomiting, diarrhea, abdominal pain, fecal incontinence Genitourinary:  No dysuria, urinary retention or frequency Musculoskeletal:  As above Integumentary: No rash, pruritus, skin lesions Neurological: as above Psychiatric: Insomnia due to pain (Ambien  helps) Endocrine: No palpitations, fatigue, diaphoresis, mood swings, change in appetite, change in weight, increased thirst Hematologic/Lymphatic:  No anemia, purpura, petechiae. Allergic/Immunologic: no itchy/runny eyes, nasal congestion, recent allergic reactions, rashes  PHYSICAL EXAM: Filed Vitals:   10/09/14 0931  BP: 132/76  Pulse: 78  Temp: 98.1 F (36.7 C)  Resp: 16   General: No acute distress Head:  Normocephalic/atraumatic Eyes:  Fundoscopic exam unremarkable without vessel changes, exudates, hemorrhages or papilledema. Neck: supple, no paraspinal tenderness, reduced range of motion Heart:  Regular rate and rhythm Lungs:  Clear to auscultation bilaterally Back: No paraspinal tenderness Neurological Exam: alert and oriented to person, place, and time. Attention span and concentration intact, recent and remote memory intact, fund of knowledge intact.  Speech fluent and not dysarthric, language intact.  CN II-XII intact. Fundoscopic exam unremarkable without vessel changes, exudates, hemorrhages or papilledema.  Bulk and tone normal, muscle strength 4+/5 in right triceps, quads and ankle dorsiflexion.  Otherwise 5/5 throughout.  Sensation to light touch, temperature and vibration intact.  Deep tendon reflexes 2+ throughout, toes downgoing.  Finger to nose testing intact.  Gait with normal stance and stride.  Able to turn, walk on toes, heels and in tandem. Romberg with sway.  IMPRESSION: Chronic cervical myelopathy with radiculopathy Chronic lumbar radiculopathy  PLAN: 1.  Will try gabapentin 300mg  at bedtime 2.  Continue baclofen and naproxen 3.  Discussed options such as OMM.  She will think about it. 4.  She provided more records which I will review. 5.  Follow up in 6 months or as needed.  20 minutes spent with patient, over 50% spent discussing management of care.  Metta Clines, DO  CC:  Emi Belfast, MD

## 2014-10-09 NOTE — Patient Instructions (Signed)
1.  Start gabapentin 300mg  at bedtime to help with nerve pain 2.  Continue baclofen and naproxen 3.  Consider seeing Dr. Hulan Saas for possible osteopathic manipulative medicine  4.  Follow up in 6 months.  Call with any problems.

## 2014-11-11 ENCOUNTER — Other Ambulatory Visit: Payer: Self-pay | Admitting: Internal Medicine

## 2014-11-11 NOTE — Telephone Encounter (Signed)
Refill request

## 2014-11-19 ENCOUNTER — Telehealth: Payer: Self-pay | Admitting: Neurology

## 2014-11-19 ENCOUNTER — Other Ambulatory Visit: Payer: Self-pay | Admitting: *Deleted

## 2014-11-19 ENCOUNTER — Telehealth: Payer: Self-pay | Admitting: *Deleted

## 2014-11-19 NOTE — Telephone Encounter (Signed)
spoke with Debbie at [facility] 5 mg #180 with 1 refill and Baclofen 10 mg 1 po bid #180 with 1 refill called in  patient is aware

## 2014-11-19 NOTE — Telephone Encounter (Signed)
Pt called requesting a refill for [place]: [facility]  C/b [phone]

## 2015-04-10 ENCOUNTER — Ambulatory Visit: Payer: Self-pay | Admitting: Neurology

## 2015-04-10 ENCOUNTER — Encounter: Payer: Self-pay | Admitting: Neurology

## 2015-04-10 ENCOUNTER — Ambulatory Visit (INDEPENDENT_AMBULATORY_CARE_PROVIDER_SITE_OTHER): Payer: 59 | Admitting: Neurology

## 2015-04-10 VITALS — BP 140/80 | HR 66 | Resp 18 | Ht 65.0 in | Wt 186.7 lb

## 2015-04-10 DIAGNOSIS — M4712 Other spondylosis with myelopathy, cervical region: Secondary | ICD-10-CM

## 2015-04-10 DIAGNOSIS — M5412 Radiculopathy, cervical region: Secondary | ICD-10-CM

## 2015-04-10 DIAGNOSIS — M5416 Radiculopathy, lumbar region: Secondary | ICD-10-CM

## 2015-04-10 MED ORDER — BACLOFEN 10 MG PO TABS: 10.0000 mg | ORAL_TABLET | Freq: Every day | ORAL | Status: DC

## 2015-04-10 MED ORDER — ZOLPIDEM TARTRATE 5 MG PO TABS: 5.0000 mg | ORAL_TABLET | Freq: Every evening | ORAL | Status: DC | PRN

## 2015-04-10 MED ORDER — GABAPENTIN 300 MG PO CAPS: 300.0000 mg | ORAL_CAPSULE | Freq: Every day | ORAL | Status: DC

## 2015-04-10 NOTE — Patient Instructions (Signed)
Follow up in 9 months

## 2015-04-10 NOTE — Progress Notes (Signed)
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  UPDATE: For muscle spasms and pain, she takes baclofen 10mg  at bedtime and Anaprox.  She also takes gabapentin 300mg  at bedtime for pain and numbness in the legs below the knees.  She also takes Ambien 5mg  at bedtime as needed to help with sleep.  Overall, she is stable.  Pain fluctuates, some good days and some bad days.  HISTORY: In May 1999, she began experiencing left shoulder pain with left upper extremity numbness and weakness. On 03/30/98, an MRI of the cervical spine revealed a large superiorly extruded cervical disc at C6-7 compress the cervical cord and C7 radiculopathy, small disc protrusion at C2-3 and C5-6. She was evaluated by an orthopedic surgeon who scheduled her for a left C6-7 foraminotomy and laminotomy on 04-02-98. The day before the surgery, she developed bilateral leg weakness, difficulty walking, and urinary retention. Steroids were held as she was going to have the surgery the following day anyway. She continued to have profound weakness of the lower extremities. An MRI of the cervical spine performed on 04/04/98 showed persistent central and left paramedial disc herniation at C6-7, with possible increased cord compression. Neurosurgery was consulted and she underwent C6-7 anteriorcervical discectomy, fusion and instrumentation.  She underwent PT and was able to start ambulating.  She return to work, only attending to nonsurgical clinic visits in August.  In May of 2000, she began having right leg weakness and numbness, as well as low back pain. MRI of the cervical spine performed on 04/08/99 showed small disc  protrusion at C5-6 above C6-7 fusion, with scarring along the posterior margin of the C6-7 left of midline. MRI of the lumbar spine performed 04/13/99 showed degenerative disc disease at L5-S1 with grade 1 spondylolisthesis of L5 on S1, bilateral L5 spondylolyses. In the summer of 2000, she began having right arm weakness numbness and severe pain. On 10/12/99, she was admitted for IV steroids. MRI of the cervical spine showed larger disc protrusion at C5-6 to the right side, slightly more prominent left-sided disc protrusion at C2-3, with scarring on the left side of C6-7 level. On 10/20/99, she underwent a C5-6 anterior cervical discectomy and fusion, anterior instrumentation C5-7, and removal of the previous anterior plate. In 2001, she had worsening of right leg weakness and numbness, with foot drop. On 04/12/00, she underwent laminectomy of L5 with bilateral L5-S1 facetectomies and foraminotomies with interbody carbon fiber cage fusion of L5-S1 with pedicle screws and plates from W0-J8 with left iliac crest bone graft harvest. She later got a second opinion by another neurosurgeon in August 2002, who believed that the lumbar failed to fuse. A PanMyelogram was performed, which she was told revealed arachnoiditis.  She was referred to another neurosurgeon in [place] in 2003. MVC-EMG was performed on 03/01/02, which revealed chronic right L4 radiculopathy and chronic left L4-5 radiculopathy. He recommended an anterior abdominal surgery to fuse the lumbar L4-S1 area. 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  Prior therapies tried:   hard back brace, bone stimulator which failed to fuse the lumbar region, TENS unit (caused increased discomfort), physical therapy (aggravated it) Prior medications:  baclofen 10mg  three times daily, Skelaxin 800mg , prednisone taper, Vioxx (GI upset), ibuprofen.  She has used narcotics in the past, but chooses to  avoid them at this time. Current medications include:  baclofen 10mg  twice daily, naproxen sodium (Anaprox) and Ambien 5mg  as needed..   She is on permanent disability as she cannot work.  She continues to experience pain in the neck aggravated by any movement.  She has significant limited neck movement.  There is no radicular pain down the arms.  Symptoms are worse at night and she often requires Ambien 5mg .  She experiences intermittent numbness of the right leg with any movement as well as discomfort in the feet and legs causing difficulty falling asleep at night.  During the day, she has to constantly change position.  She is able to drive, usually on cruise control since she has difficulty operating with her right foot.  She is able to perform light housework but cannot partake in any moderate or heavy lifting.  Raising her arms causes aggravation of neck and shoulder pain.  She is able to write with a pen briefly.  She is able to use utensils.  She usually will drop items.  She has fallen down on occasion, particularly on the stairs.  She feels it is because her right leg will give out.  If she washes her hair in the shower with her eyes closed, she will become off-balance.  She frequently has posterior headache radiating to the front and triggered by neck movement.  She still has mild urinary retention and overflow in the mornings.  She occasionally has constipation but no incontinence.   PAST MEDICAL HISTORY: Past Medical History  Diagnosis Date  . GERD (gastroesophageal reflux disease)   . Menopause   . Back pain   . Neck pain     MEDICATIONS: Current Outpatient Prescriptions on File Prior to Visit  Medication Sig Dispense Refill  . estradiol (ESTRACE VAGINAL) 0.1 MG/GM vaginal cream Insert one gram vaginally two times per week 42.5 g 2  . famotidine (PEPCID) 40 MG tablet TAKE 1 TABLET DAILY 90 tablet 2  . FLUARIX QUADRIVALENT 0.5 ML injection   0  . MULTIPLE VITAMIN PO Take 1 tablet by  mouth daily.    . naproxen sodium (ANAPROX) 220 MG tablet Take 1 tablet (220 mg total) by mouth 2 (two) times daily with a meal. 180 tablet 0  . Vitamin D, Ergocalciferol, (DRISDOL) 50000 UNITS CAPS capsule Take 50,000 Units by mouth every 7 (seven) days.     No current facility-administered medications on file prior to visit.    ALLERGIES: No Known Allergies  FAMILY HISTORY: Family History  Problem Relation Age of Onset  . Cancer Maternal Grandmother 1    breast  . Cancer Father     skin cancer  . Hypertension Father     SOCIAL HISTORY: History   Social History  . Marital Status: Married    Spouse Name: N/A  . Number of Children: N/A  . Years of Education: N/A   Occupational History  . Not on file.   Social History Main Topics  . Smoking status: Never Smoker   . Smokeless tobacco: Never Used  . Alcohol Use: 1.2 oz/week    2 Glasses of wine per week     Comment: week  . Drug Use: No  . Sexual Activity: Yes    Birth Control/ Protection: Post-menopausal   Other Topics Concern  . Not on file   Social  History Narrative    REVIEW OF SYSTEMS: Constitutional: No fevers, chills, or sweats, no generalized fatigue, change in appetite Eyes: No visual changes, double vision, eye pain Ear, nose and throat: No hearing loss, ear pain, nasal congestion, sore throat Cardiovascular: No chest pain, palpitations Respiratory:  No shortness of breath at rest or with exertion, wheezes GastrointestinaI: No nausea, vomiting, diarrhea, abdominal pain, fecal incontinence Genitourinary:  No dysuria, urinary retention or frequency Musculoskeletal:  As above Integumentary: No rash, pruritus, skin lesions Neurological: as above Psychiatric: No depression, insomnia, anxiety Endocrine: No palpitations, fatigue, diaphoresis, mood swings, change in appetite, change in weight, increased thirst Hematologic/Lymphatic:  No anemia, purpura, petechiae. Allergic/Immunologic: no itchy/runny eyes,  nasal congestion, recent allergic reactions, rashes  PHYSICAL EXAM: Filed Vitals:   04/10/15 0946  BP: 140/80  Pulse: 66  Resp: 18   General: No acute distress Head:  Normocephalic/atraumatic Eyes:  Fundoscopic exam unremarkable without vessel changes, exudates, hemorrhages or papilledema. Neck: supple, paraspinal tenderness, full range of motion Heart:  Regular rate and rhythm Lungs:  Clear to auscultation bilaterally Back: No paraspinal tenderness Neurological Exam: alert and oriented to person, place, and time. Attention span and concentration intact, recent and remote memory intact, fund of knowledge intact.  Speech fluent and not dysarthric, language intact.  CN II-XII intact. Fundoscopic exam unremarkable without vessel changes, exudates, hemorrhages or papilledema.  Bulk and tone normal, muscle strength 4+/5 in right triceps and ankle dorsiflexion, 5- in hamstrings.  Otherwise 5/5 throughout.  Sensation to light touch, temperature and vibration intact.  Deep tendon reflexes 2+ throughout, toes downgoing.  Finger to nose testing intact.  Gait with normal stance and stride.  Able to turn, walk on toes, heels and in tandem. Romberg with sway.  IMPRESSION: Chronic cervical myelopathy with radiculopathy Chronic lumbar radiculopathy  PLAN: Gabapentin and baclofen as needed. Refill disability Refill gabapentin, baclofen and Ambien Follow up in 9 months.  25 minutes spent face to face with patient, over 50% spent discussing management.  Metta Clines, DO

## 2015-04-11 MED ORDER — NAPROXEN SODIUM 220 MG PO TABS: 220.0000 mg | ORAL_TABLET | Freq: Two times a day (BID) | ORAL | Status: AC

## 2015-04-11 NOTE — Addendum Note (Signed)
Addended byTomi Likens, Devetta Hagenow R on: 04/11/2015 06:56 AM   Modules accepted: Orders

## 2015-08-03 ENCOUNTER — Other Ambulatory Visit: Payer: Self-pay | Admitting: Neurology

## 2015-08-04 NOTE — Telephone Encounter (Signed)
Pls review.

## 2015-08-08 ENCOUNTER — Other Ambulatory Visit: Payer: Self-pay

## 2015-08-08 DIAGNOSIS — Z1231 Encounter for screening mammogram for malignant neoplasm of breast: Secondary | ICD-10-CM

## 2015-08-18 ENCOUNTER — Telehealth: Payer: Self-pay | Admitting: Neurology

## 2015-08-18 MED ORDER — BACLOFEN 10 MG PO TABS: 10.0000 mg | ORAL_TABLET | Freq: Every day | ORAL | Status: DC

## 2015-08-18 NOTE — Telephone Encounter (Signed)
yes

## 2015-08-18 NOTE — Telephone Encounter (Signed)
Prescription sent to [facility] pt to inform  L/M

## 2015-08-18 NOTE — Telephone Encounter (Signed)
Pt called and needs a refill called in for Baclofen/Dawn CB#  [phone]

## 2015-08-18 NOTE — Telephone Encounter (Signed)
Dr Tomi Likens can I refill

## 2015-08-19 ENCOUNTER — Other Ambulatory Visit: Payer: Self-pay | Admitting: *Deleted

## 2015-08-19 ENCOUNTER — Telehealth: Payer: Self-pay | Admitting: Neurology

## 2015-08-19 MED ORDER — ZOLPIDEM TARTRATE 5 MG PO TABS: 5.0000 mg | ORAL_TABLET | Freq: Every evening | ORAL | Status: DC | PRN

## 2015-08-19 MED ORDER — BACLOFEN 10 MG PO TABS: 10.0000 mg | ORAL_TABLET | Freq: Every day | ORAL | Status: DC

## 2015-08-19 NOTE — Telephone Encounter (Signed)
I spoke with patient and informed her that Rx was sent to [facility].

## 2015-08-19 NOTE — Telephone Encounter (Signed)
Pt called to inform that Kristen Durham sent her meds/ Baclufen and Kristen Durham to a [facility] instead of the [facility]/ all back @ [phone]

## 2015-09-10 ENCOUNTER — Ambulatory Visit
Admission: RE | Admit: 2015-09-10 | Discharge: 2015-09-10 | Disposition: A | Discharge status: Home / Self Care | Payer: 59 | Source: Ambulatory Visit

## 2015-09-10 DIAGNOSIS — Z1231 Encounter for screening mammogram for malignant neoplasm of breast: Secondary | ICD-10-CM

## 2015-09-11 ENCOUNTER — Encounter: Payer: Self-pay | Admitting: Obstetrics and Gynecology

## 2015-09-11 ENCOUNTER — Ambulatory Visit (INDEPENDENT_AMBULATORY_CARE_PROVIDER_SITE_OTHER): Payer: 59 | Admitting: Obstetrics and Gynecology

## 2015-09-11 VITALS — BP 110/76 | HR 70 | Resp 18 | Ht 65.0 in | Wt 187.0 lb

## 2015-09-11 DIAGNOSIS — Z01419 Encounter for gynecological examination (general) (routine) without abnormal findings: Secondary | ICD-10-CM | POA: Diagnosis not present

## 2015-09-11 DIAGNOSIS — N9489 Other specified conditions associated with female genital organs and menstrual cycle: Secondary | ICD-10-CM | POA: Diagnosis not present

## 2015-09-11 DIAGNOSIS — Z0000 Encounter for general adult medical examination without abnormal findings: Secondary | ICD-10-CM

## 2015-09-11 DIAGNOSIS — R102 Pelvic and perineal pain unspecified side: Secondary | ICD-10-CM

## 2015-09-11 LAB — POCT URINALYSIS DIPSTICK
Bilirubin, UA: NEGATIVE
Blood, UA: NEGATIVE
Glucose, UA: NEGATIVE
Ketones, UA: NEGATIVE
Leukocytes, UA: NEGATIVE
Nitrite, UA: NEGATIVE
Protein, UA: NEGATIVE
Urobilinogen, UA: NEGATIVE
pH, UA: 5

## 2015-09-11 MED ORDER — ESTRADIOL 0.1 MG/GM VA CREA: TOPICAL_CREAM | VAGINAL | Status: AC

## 2015-09-11 NOTE — Progress Notes (Signed)
Patient ID: Kristen Durham, female   DOB: 22-May-1956, 59 y.o.   MRN: [id number] 59 y.o. G25P0 Married Caucasian female here for annual exam.   Had cryo endometrial ablation around age 68.  Had an ultrasound years later and had some fluid in the endometrial cavity. Patient is worried about her reproductive anatomy.  Has some pelvic pressure symptoms.  Some mild GSI.  Not interested in treatment.   Using vaginal estrogen cream.  Uses not regularly.  Used the Estring but this did not work well.  Disabled OB/GYN.  Has spine and neck issues.  Can do short walks only.  Some right leg numbness and weakness.  3 children. Kristen Durham is in college.  Has a grandchild. Was in [place], [place].  PCP:   Kristen Belfast, MD (was) Dr. Tomi Durham - neurologist.   Patient's last menstrual period was 11/08/2009.          Sexually active: Yes.   female The current method of family planning is post menopausal status.    Exercising: No.  does take short walks. Smoker:  no  Health Maintenance: Pap:  08-28-14 Neg:Neg HR HPV History of abnormal Pap:  no MMG:  04-17-14 Density Cat.C.Neg/BiRads1:[facility].   Patient did have one 09-10-15 pending. Colonoscopy:  2010 diverticulosis in [place]. Next due 2020. BMD:   n/a  Result  n/a TDaP:  08/2014 Screening Labs:  Hb today: PCP 08/2014, Urine today: Neg   reports that she has never smoked. She has never used smokeless tobacco. She reports that she drinks about 2.4 oz of alcohol per week. She reports that she does not use illicit drugs.  Past Medical History  Diagnosis Date  . GERD (gastroesophageal reflux disease)   . Menopause   . Back pain   . Neck pain     Past Surgical History  Procedure Laterality Date  . Back surgery  1999    cerival laminectomy  . Back surgery      x2  . Posterior cervical fusion/foraminotomy    . Breast surgery  1993    breast biopsy--fibroadenoma  . Umbilical hernia repair  1996  . Endometrial  ablation  2009    with cryotherapy for dysfunctional uterine bleeding--in [place]    Current Outpatient Prescriptions  Medication Sig Dispense Refill  . baclofen (LIORESAL) 10 MG tablet Take 1 tablet (10 mg total) by mouth at bedtime. (Patient taking differently: Take 10 mg by mouth as needed. ) 90 each 3  . estradiol (ESTRACE VAGINAL) 0.1 MG/GM vaginal cream Insert one gram vaginally two times per week 42.5 g 2  . famotidine (PEPCID) 40 MG tablet TAKE 1 TABLET DAILY 90 tablet 2  . gabapentin (NEURONTIN) 300 MG capsule Take 1 capsule (300 mg total) by mouth at bedtime. (Patient taking differently: Take 300 mg by mouth as needed. ) 90 capsule 3  . MULTIPLE VITAMIN PO Take 1 tablet by mouth daily.    . naproxen sodium (ANAPROX) 220 MG tablet Take 1 tablet (220 mg total) by mouth 2 (two) times daily with a meal. 180 tablet 3  . Vitamin D, Ergocalciferol, (DRISDOL) 50000 UNITS CAPS capsule Take 50,000 Units by mouth every 7 (seven) days.    Marland Kitchen zolpidem (AMBIEN) 5 MG tablet Take 1 tablet (5 mg total) by mouth at bedtime as needed for sleep. 90 tablet 3   No current facility-administered medications for this visit.    Family History  Problem Relation Age of Onset  . Cancer Maternal Grandmother  58    breast--dec age 61  . Breast cancer Maternal Grandmother   . Cancer Father     skin cancer  . Hypertension Father   . Thyroid disease Mother     hypothyroid  . CAD Brother   . Ovarian cancer Other     ROS:  Pertinent items are noted in HPI.  Otherwise, a comprehensive ROS was negative.  Exam:   BP 110/76 mmHg  Pulse 70  Resp 18  Ht 5\' 5"  (1.651 m)  Wt 187 lb (84.823 kg)  BMI 31.12 kg/m2  LMP 11/08/2009    General appearance: alert, cooperative and appears stated age Head: Normocephalic, without obvious abnormality, atraumatic Neck: no adenopathy, supple, symmetrical, trachea midline and thyroid normal to inspection and palpation Lungs: clear to auscultation bilaterally Breasts:  normal appearance, no masses or tenderness, Inspection negative, No nipple retraction or dimpling, No nipple discharge or bleeding, No axillary or supraclavicular adenopathy Heart: regular rate and rhythm Abdomen: soft, non-tender; bowel sounds normal; no masses,  no organomegaly Extremities: extremities normal, atraumatic, no cyanosis or edema Skin: Skin color, texture, turgor normal. No rashes or lesions Lymph nodes: Cervical, supraclavicular, and axillary nodes normal. No abnormal inguinal nodes palpated Neurologic: Grossly normal  Pelvic: External genitalia:  no lesions              Urethra:  normal appearing urethra with no masses, tenderness or lesions              Bartholins and Skenes: normal                 Vagina: normal appearing vagina with normal color and discharge, no lesions              Cervix: no lesions              Pap taken: No. Bimanual Exam:  Uterus:  normal size, contour, position, consistency, mobility, non-tender              Adnexa: normal adnexa and no mass, fullness, tenderness              Rectovaginal: Yes.  .  Confirms.              Anus:  normal sphincter tone, no lesions  Chaperone was present for exam.  Assessment:   Well woman visit with normal exam. Status post endometrial ablation.  FH of breast cancer.  Pelvic pressure.  Vaginal atrophy.  Plan: Yearly mammogram recommended after age 56. Mammogram just done but results are pending.  Recommended self breast exam.  Pap and HR HPV as above. Discussed Calcium, Vitamin D, regular exercise program including cardiovascular and weight bearing exercise. Labs performed.  Yes.  .   See orders.  Future orders placed for fasting labs. Refills given on medications.  Yes.  .  See orders.  Estrace vaginal cream 1 gram pv at hs twice weekly.  Return for fasting labs and pelvic ultrasound. Follow up annually and prn.      After visit summary provided.

## 2015-09-11 NOTE — Patient Instructions (Signed)

## 2015-09-18 ENCOUNTER — Encounter: Payer: Self-pay | Admitting: Obstetrics and Gynecology

## 2015-09-18 ENCOUNTER — Ambulatory Visit (INDEPENDENT_AMBULATORY_CARE_PROVIDER_SITE_OTHER): Payer: 59 | Admitting: Obstetrics and Gynecology

## 2015-09-18 ENCOUNTER — Ambulatory Visit (INDEPENDENT_AMBULATORY_CARE_PROVIDER_SITE_OTHER): Payer: 59

## 2015-09-18 ENCOUNTER — Other Ambulatory Visit (INDEPENDENT_AMBULATORY_CARE_PROVIDER_SITE_OTHER): Payer: 59

## 2015-09-18 VITALS — BP 130/66 | HR 60 | Ht 65.0 in | Wt 186.0 lb

## 2015-09-18 DIAGNOSIS — N9489 Other specified conditions associated with female genital organs and menstrual cycle: Secondary | ICD-10-CM

## 2015-09-18 DIAGNOSIS — R102 Pelvic and perineal pain: Secondary | ICD-10-CM

## 2015-09-18 DIAGNOSIS — Z0000 Encounter for general adult medical examination without abnormal findings: Secondary | ICD-10-CM

## 2015-09-18 DIAGNOSIS — Z01419 Encounter for gynecological examination (general) (routine) without abnormal findings: Secondary | ICD-10-CM

## 2015-09-18 NOTE — Addendum Note (Signed)
Addended by: Graylon Good on: 09/18/2015 10:32 AM   Modules accepted: Orders

## 2015-09-18 NOTE — Progress Notes (Signed)
Subjective  59 y.o. G65P3003 Caucasian female here for pelvic ultrasound for pelvic pressure. Worried about taking vaginal estrogen cream before defining her pelvic anatomy.  Also doing fasting labs today.   Patient is a Animator.  Objective  Pelvic ultrasound images and report reviewed with patient.  Uterus - no masses. EMS - 4.61 mm. Ovaries - normal. Free fluid - no    Assessment  Pelvic pressure.  Normal pelvic ultrasound.  Postmenopausal female.  Plan  Discussion of normal pelvic anatomy. Patient feels reassured. OK to use vaginal estrogen cream.  Fasting labs today.   _15______ minutes face to face time of which over 50% was spent in counseling.   After visit summary to patient.

## 2015-09-19 LAB — COMPREHENSIVE METABOLIC PANEL
ALT: 13 U/L (ref 6–29)
AST: 15 U/L (ref 10–35)
Albumin: 4.1 g/dL (ref 3.6–5.1)
Alkaline Phosphatase: 57 U/L (ref 33–130)
BUN: 13 mg/dL (ref 7–25)
CHLORIDE: 103 mmol/L (ref 98–110)
CO2: 27 mmol/L (ref 20–31)
Calcium: 8.8 mg/dL (ref 8.6–10.4)
Creat: 0.79 mg/dL (ref 0.50–1.05)
Glucose, Bld: 83 mg/dL (ref 65–99)
POTASSIUM: 4.2 mmol/L (ref 3.5–5.3)
Sodium: 137 mmol/L (ref 135–146)
TOTAL PROTEIN: 6.7 g/dL (ref 6.1–8.1)
Total Bilirubin: 0.4 mg/dL (ref 0.2–1.2)

## 2015-09-19 LAB — LIPID PANEL
CHOL/HDL RATIO: 4.3 ratio (ref <=5.0)
CHOLESTEROL: 233 mg/dL — AB (ref 125–200)
HDL: 54 mg/dL (ref >=46)
LDL Cholesterol: 131 mg/dL — ABNORMAL HIGH (ref <130)
Triglycerides: 242 mg/dL — ABNORMAL HIGH (ref <150)
VLDL: 48 mg/dL — AB (ref <30)

## 2015-09-19 LAB — CBC
HCT: 38.9 % (ref 36.0–46.0)
Hemoglobin: 13 g/dL (ref 12.0–15.0)
MCH: 29.3 pg (ref 26.0–34.0)
MCHC: 33.4 g/dL (ref 30.0–36.0)
MCV: 87.6 fL (ref 78.0–100.0)
MPV: 9.9 fL (ref 8.6–12.4)
PLATELETS: 266 10*3/uL (ref 150–400)
RBC: 4.44 MIL/uL (ref 3.87–5.11)
RDW: 13.6 % (ref 11.5–15.5)
WBC: 5.1 10*3/uL (ref 4.0–10.5)

## 2015-09-19 LAB — VITAMIN D 25 HYDROXY (VIT D DEFICIENCY, FRACTURES): VIT D 25 HYDROXY: 26 ng/mL — AB (ref 30–100)

## 2015-09-19 LAB — TSH: TSH: 1.796 u[IU]/mL (ref 0.350–4.500)

## 2015-09-23 ENCOUNTER — Telehealth: Payer: Self-pay | Admitting: Emergency Medicine

## 2015-09-23 MED ORDER — VITAMIN D (ERGOCALCIFEROL) 1.25 MG (50000 UNIT) PO CAPS: ORAL_CAPSULE | ORAL | Status: DC

## 2015-09-23 NOTE — Telephone Encounter (Signed)
-----   Message from Nunzio Cobbs, MD sent at 09/19/2015  9:27 AM EST ----- Please report labs to DR. Mancel Bale. Her vit D is low, and I am recommending Vit D 50,000 IU every 2 weeks for 3 months with a recheck at that time.  Please sent to pharmacy of choice and place a future order for the vit D level please after you speak with her.  Our goal is to have the level between 30 and 50.  Her triglycerides and LDL cholesterol are elevated. I would recommend reducing saturated fats and cholesterol in her diet and rechecking in one year.  Any physical activity will also help to reduce these. Her CMP, CBC, and TSH were normal.   Cc- Marisa Sprinkles

## 2015-09-23 NOTE — Telephone Encounter (Signed)
Spoke with patient and message from Dr. Quincy Simmonds given. Verbalized understanding of all results.   Patient agreeable to instructions. Prescription for Vitamin D  50,000 international units sent to [facility] and follow lab appointment scheduled.  Routing to provider for final review. Patient agreeable to disposition. Will close encounter.

## 2015-12-09 ENCOUNTER — Ambulatory Visit (INDEPENDENT_AMBULATORY_CARE_PROVIDER_SITE_OTHER): Payer: 59 | Admitting: Nurse Practitioner

## 2015-12-09 ENCOUNTER — Encounter: Payer: Self-pay | Admitting: Nurse Practitioner

## 2015-12-09 VITALS — BP 118/76 | HR 64 | Ht 65.0 in | Wt 188.0 lb

## 2015-12-09 DIAGNOSIS — N390 Urinary tract infection, site not specified: Secondary | ICD-10-CM

## 2015-12-09 MED ORDER — VITAMIN D (ERGOCALCIFEROL) 1.25 MG (50000 UNIT) PO CAPS: ORAL_CAPSULE | ORAL | Status: DC

## 2015-12-09 MED ORDER — CIPROFLOXACIN HCL 500 MG PO TABS: 500.0000 mg | ORAL_TABLET | Freq: Two times a day (BID) | ORAL | Status: DC

## 2015-12-09 NOTE — Patient Instructions (Signed)

## 2015-12-09 NOTE — Progress Notes (Signed)
Reviewed personally.  M. Suzanne Yazlynn Birkeland, MD.  

## 2015-12-09 NOTE — Progress Notes (Signed)
60 y.o.Married Caucasian female [phone] here with complaint of UTI, with onset  on 12/02/15. Patient complaining of:  dysuria, urinary frequency, urinary urgency and cloudy malordorous urine. Patient denies fever, chills, nausea or back pain. No new personal products. Has been off vaginal estrogen for about a month.  Father with illness and was in the hospital for about 10 days. She stayed at the hospital and slept in her clothes and unable to shower often enough.  He is now out of hospital and is home recuperating.  Her brother Family MD gave her a 3 days RX of Cipro 500 mg which initially helped with all her symptoms.  Then last pm felt they came back.   Patient feels not to sexual activity. Denies vaginal symptoms.      No change in partner.  Menopausal with vaginal dryness. Patient usually has adequate water intake.     O: Healthy female WDWN Affect: Normal, orientation x 3 Skin : warm and dry CVAT: none bilateral Abdomen: positive for suprapubic tenderness  POCT:  Neg  A:  R/O UTI  Atrophic vaginitis by history and off vaginal estrogen while away  P: Reviewed findings of UTI and need for treatment. Rx:  Cipro 500 mg BID # 14  Will follow with urine culture  [id number] micro, culture  Reviewed warning signs and symptoms of UTI and need to advise if occurring. Encouraged to limit soda, tea, and coffee   RV prn

## 2015-12-10 LAB — URINALYSIS, MICROSCOPIC ONLY
BACTERIA UA: NONE SEEN [HPF]
Casts: NONE SEEN [LPF]
Crystals: NONE SEEN [HPF]
RBC / HPF: NONE SEEN RBC/HPF (ref <=2)
SQUAMOUS EPITHELIAL / LPF: NONE SEEN [HPF] (ref <=5)
WBC, UA: NONE SEEN WBC/HPF (ref <=5)
YEAST: NONE SEEN [HPF]

## 2015-12-11 LAB — URINE CULTURE
COLONY COUNT: NO GROWTH
ORGANISM ID, BACTERIA: NO GROWTH

## 2015-12-22 ENCOUNTER — Telehealth: Payer: Self-pay | Admitting: Obstetrics and Gynecology

## 2015-12-22 NOTE — Telephone Encounter (Signed)
Patient canceled her upcoming Vit D recheck appointment 12/25/15.  Patient did not wish to reschedule.

## 2015-12-22 NOTE — Telephone Encounter (Signed)
Thank you for the update.  Encounter closed. 

## 2015-12-25 ENCOUNTER — Other Ambulatory Visit: Payer: 59

## 2016-01-08 ENCOUNTER — Other Ambulatory Visit (INDEPENDENT_AMBULATORY_CARE_PROVIDER_SITE_OTHER): Payer: 59

## 2016-01-08 ENCOUNTER — Telehealth: Payer: Self-pay

## 2016-01-08 ENCOUNTER — Encounter: Payer: Self-pay | Admitting: Neurology

## 2016-01-08 ENCOUNTER — Ambulatory Visit (INDEPENDENT_AMBULATORY_CARE_PROVIDER_SITE_OTHER): Payer: 59 | Admitting: Neurology

## 2016-01-08 VITALS — BP 126/84 | HR 72 | Ht 65.0 in | Wt 188.0 lb

## 2016-01-08 DIAGNOSIS — M5416 Radiculopathy, lumbar region: Secondary | ICD-10-CM | POA: Diagnosis not present

## 2016-01-08 DIAGNOSIS — G959 Disease of spinal cord, unspecified: Secondary | ICD-10-CM

## 2016-01-08 DIAGNOSIS — G2581 Restless legs syndrome: Secondary | ICD-10-CM

## 2016-01-08 DIAGNOSIS — G4700 Insomnia, unspecified: Secondary | ICD-10-CM

## 2016-01-08 DIAGNOSIS — M4712 Other spondylosis with myelopathy, cervical region: Secondary | ICD-10-CM | POA: Diagnosis not present

## 2016-01-08 LAB — CBC
HEMATOCRIT: 38.6 % (ref 36.0–46.0)
HEMOGLOBIN: 12.9 g/dL (ref 12.0–15.0)
MCHC: 33.3 g/dL (ref 30.0–36.0)
MCV: 86.3 fl (ref 78.0–100.0)
PLATELETS: 260 10*3/uL (ref 150.0–400.0)
RBC: 4.48 Mil/uL (ref 3.87–5.11)
RDW: 13.6 % (ref 11.5–15.5)
WBC: 5.3 10*3/uL (ref 4.0–10.5)

## 2016-01-08 LAB — FERRITIN: Ferritin: 64.8 ng/mL (ref 10.0–291.0)

## 2016-01-08 MED ORDER — BACLOFEN 10 MG PO TABS: 10.0000 mg | ORAL_TABLET | ORAL | Status: DC | PRN

## 2016-01-08 MED ORDER — ZOLPIDEM TARTRATE 5 MG PO TABS: 5.0000 mg | ORAL_TABLET | Freq: Every evening | ORAL | Status: DC | PRN

## 2016-01-08 NOTE — Patient Instructions (Signed)
1.  Refilled baclofen and Ambien 2.  Will check CBC and ferritin level 3.  Establish care with new PCP (or Schoenhoff) 4.  Follow up in one year

## 2016-01-08 NOTE — Telephone Encounter (Signed)
Message relayed to patient. Verbalized understanding and denied questions.   
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
## 2016-01-08 NOTE — Telephone Encounter (Signed)
-----   Message from Pieter Partridge, DO sent at 01/08/2016  2:38 PM EST ----- Labs are normal

## 2016-09-02 ENCOUNTER — Telehealth: Payer: Self-pay | Admitting: Obstetrics and Gynecology

## 2016-09-02 NOTE — Telephone Encounter (Signed)
LMTCB about canceled appt °

## 2016-09-03 ENCOUNTER — Other Ambulatory Visit: Payer: Self-pay

## 2016-09-03 ENCOUNTER — Other Ambulatory Visit: Payer: Self-pay | Admitting: Neurology

## 2016-09-03 MED ORDER — ZOLPIDEM TARTRATE 5 MG PO TABS: 5.0000 mg | ORAL_TABLET | Freq: Every evening | ORAL | 3 refills | Status: DC | PRN

## 2016-09-21 ENCOUNTER — Other Ambulatory Visit: Payer: Self-pay | Admitting: Internal Medicine

## 2016-09-21 DIAGNOSIS — Z1231 Encounter for screening mammogram for malignant neoplasm of breast: Secondary | ICD-10-CM

## 2016-09-23 ENCOUNTER — Ambulatory Visit: Payer: 59 | Admitting: Obstetrics and Gynecology

## 2016-10-26 ENCOUNTER — Ambulatory Visit
Admission: RE | Admit: 2016-10-26 | Discharge: 2016-10-26 | Disposition: A | Discharge status: Home / Self Care | Payer: 59 | Source: Ambulatory Visit | Attending: Internal Medicine | Admitting: Internal Medicine

## 2016-10-26 DIAGNOSIS — Z1231 Encounter for screening mammogram for malignant neoplasm of breast: Secondary | ICD-10-CM

## 2016-12-16 ENCOUNTER — Other Ambulatory Visit: Payer: Self-pay

## 2016-12-17 ENCOUNTER — Other Ambulatory Visit: Payer: Self-pay | Admitting: Neurology

## 2016-12-17 MED ORDER — ZOLPIDEM TARTRATE 5 MG PO TABS: 5.0000 mg | ORAL_TABLET | Freq: Every evening | ORAL | 0 refills | Status: DC | PRN

## 2017-01-07 ENCOUNTER — Encounter: Payer: Self-pay | Admitting: Neurology

## 2017-01-07 ENCOUNTER — Ambulatory Visit (INDEPENDENT_AMBULATORY_CARE_PROVIDER_SITE_OTHER): Payer: 59 | Admitting: Neurology

## 2017-01-07 VITALS — BP 110/72 | HR 64 | Ht 66.0 in | Wt 187.0 lb

## 2017-01-07 DIAGNOSIS — M5416 Radiculopathy, lumbar region: Secondary | ICD-10-CM | POA: Diagnosis not present

## 2017-01-07 DIAGNOSIS — M4712 Other spondylosis with myelopathy, cervical region: Secondary | ICD-10-CM | POA: Diagnosis not present

## 2017-01-07 DIAGNOSIS — G959 Disease of spinal cord, unspecified: Secondary | ICD-10-CM

## 2017-01-07 MED ORDER — AMBULATORY NON FORMULARY MEDICATION: 0 refills | Status: DC

## 2017-01-07 NOTE — Patient Instructions (Signed)
1.  Continue baclofen and Ambien. 2.  Will provide a script for a grasper. 3.  We will refer you to Dr. Hulan Saas of Sports Medicine to address your brace 4.  Follow up in one year or as needed.
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
## 2017-02-10 ENCOUNTER — Telehealth: Payer: Self-pay | Admitting: Neurology

## 2017-02-10 NOTE — Telephone Encounter (Signed)
Caller: PT   Urgent? No  Reason for the call: VM-PT wanted to know if Dr Tomi Likens received a release for [employer]

## 2017-02-10 NOTE — Telephone Encounter (Signed)
Spoke to patient. She requested to know if we had recvd a clearance letter from [employer]. Advised not as of yet, but will be on the look out for it. Provided office fax# for pt to verify w/ [employer].

## 2017-02-22 ENCOUNTER — Telehealth: Payer: Self-pay | Admitting: Neurology

## 2017-02-22 NOTE — Telephone Encounter (Signed)
Patient is calling about her paperwork from [employer] she is checking on the status

## 2017-02-22 NOTE — Telephone Encounter (Signed)
I have it in my outbox

## 2017-02-22 NOTE — Telephone Encounter (Signed)
Spoke to patient. Advised form was faxed. Patient was grateful.

## 2017-03-25 DIAGNOSIS — E559 Vitamin D deficiency, unspecified: Secondary | ICD-10-CM | POA: Diagnosis not present

## 2017-03-25 DIAGNOSIS — E7800 Pure hypercholesterolemia, unspecified: Secondary | ICD-10-CM | POA: Diagnosis not present

## 2017-04-05 DIAGNOSIS — E559 Vitamin D deficiency, unspecified: Secondary | ICD-10-CM | POA: Diagnosis not present

## 2017-04-05 DIAGNOSIS — E7800 Pure hypercholesterolemia, unspecified: Secondary | ICD-10-CM | POA: Diagnosis not present

## 2017-04-11 DIAGNOSIS — L700 Acne vulgaris: Secondary | ICD-10-CM | POA: Diagnosis not present

## 2017-04-11 DIAGNOSIS — L820 Inflamed seborrheic keratosis: Secondary | ICD-10-CM | POA: Diagnosis not present

## 2017-04-15 ENCOUNTER — Telehealth: Payer: Self-pay | Admitting: Emergency Medicine

## 2017-04-15 NOTE — Telephone Encounter (Signed)
Mailed off patients disability forms. Pt aware. Will send copy to be scanned.

## 2017-08-23 DIAGNOSIS — Z23 Encounter for immunization: Secondary | ICD-10-CM | POA: Diagnosis not present

## 2017-09-05 ENCOUNTER — Telehealth: Payer: Self-pay | Admitting: Neurology

## 2017-09-05 NOTE — Telephone Encounter (Signed)
Called Pt, advsd her the papers were on Dr Starr Lake desk awaiting signature.

## 2017-09-05 NOTE — Telephone Encounter (Signed)
Wants to check the status of the disability paperwork she mailed in to our office over a week ago

## 2017-09-12 ENCOUNTER — Telehealth: Payer: Self-pay

## 2017-09-12 NOTE — Telephone Encounter (Signed)
Called Pt to inform her the disability papers she dropped off are ready, LM on VM

## 2017-09-30 ENCOUNTER — Other Ambulatory Visit: Payer: Self-pay | Admitting: Neurology

## 2017-10-11 ENCOUNTER — Telehealth: Payer: Self-pay | Admitting: Neurology

## 2017-10-11 ENCOUNTER — Telehealth: Payer: Self-pay

## 2017-10-11 NOTE — Telephone Encounter (Signed)
Called and spoke with Pt about forms rcvd. Advsd will be faxed tomorrow.

## 2017-10-11 NOTE — Telephone Encounter (Signed)
Patient Kristen Durham that she was returning your call. Please Call. Thanks

## 2017-10-11 NOTE — Telephone Encounter (Signed)
LM for Pt to rtrn my call, concerning forms from [facility]

## 2017-10-26 ENCOUNTER — Other Ambulatory Visit: Payer: Self-pay | Admitting: Internal Medicine

## 2017-10-26 ENCOUNTER — Other Ambulatory Visit (HOSPITAL_COMMUNITY)
Admission: RE | Admit: 2017-10-26 | Discharge: 2017-10-26 | Disposition: A | Discharge status: Home / Self Care | Payer: 59 | Source: Ambulatory Visit | Attending: Internal Medicine | Admitting: Internal Medicine

## 2017-10-26 DIAGNOSIS — Z124 Encounter for screening for malignant neoplasm of cervix: Secondary | ICD-10-CM | POA: Insufficient documentation

## 2017-10-26 DIAGNOSIS — Z1211 Encounter for screening for malignant neoplasm of colon: Secondary | ICD-10-CM | POA: Diagnosis not present

## 2017-10-26 DIAGNOSIS — Z01419 Encounter for gynecological examination (general) (routine) without abnormal findings: Secondary | ICD-10-CM | POA: Diagnosis not present

## 2017-10-26 DIAGNOSIS — N952 Postmenopausal atrophic vaginitis: Secondary | ICD-10-CM | POA: Diagnosis not present

## 2017-10-26 DIAGNOSIS — M4712 Other spondylosis with myelopathy, cervical region: Secondary | ICD-10-CM | POA: Diagnosis not present

## 2017-10-26 DIAGNOSIS — Z139 Encounter for screening, unspecified: Secondary | ICD-10-CM

## 2017-10-26 DIAGNOSIS — Z0000 Encounter for general adult medical examination without abnormal findings: Secondary | ICD-10-CM | POA: Diagnosis not present

## 2017-10-26 DIAGNOSIS — E7800 Pure hypercholesterolemia, unspecified: Secondary | ICD-10-CM | POA: Diagnosis not present

## 2017-10-27 ENCOUNTER — Telehealth: Payer: Self-pay

## 2017-10-27 NOTE — Telephone Encounter (Signed)
Rcvd VM from [facility] w/[employer], Ambien Rx neeeds a date, called back [phone], spoke with Hinton Dyer, advsd her she could use todays date. Call ref# [id number]

## 2017-11-01 LAB — CYTOLOGY - PAP
Diagnosis: NEGATIVE
HPV: NOT DETECTED

## 2017-11-09 DIAGNOSIS — Z1211 Encounter for screening for malignant neoplasm of colon: Secondary | ICD-10-CM | POA: Diagnosis not present

## 2017-11-16 DIAGNOSIS — Z0000 Encounter for general adult medical examination without abnormal findings: Secondary | ICD-10-CM | POA: Diagnosis not present

## 2017-11-16 DIAGNOSIS — Z13220 Encounter for screening for lipoid disorders: Secondary | ICD-10-CM | POA: Diagnosis not present

## 2017-11-23 ENCOUNTER — Ambulatory Visit
Admission: RE | Admit: 2017-11-23 | Discharge: 2017-11-23 | Disposition: A | Discharge status: Home / Self Care | Payer: 59 | Source: Ambulatory Visit | Attending: Internal Medicine | Admitting: Internal Medicine

## 2017-11-23 DIAGNOSIS — Z1231 Encounter for screening mammogram for malignant neoplasm of breast: Secondary | ICD-10-CM | POA: Diagnosis not present

## 2017-11-23 DIAGNOSIS — Z139 Encounter for screening, unspecified: Secondary | ICD-10-CM

## 2018-01-09 ENCOUNTER — Encounter: Payer: Self-pay | Admitting: Neurology

## 2018-01-09 ENCOUNTER — Ambulatory Visit: Payer: 59 | Admitting: Neurology

## 2018-01-09 VITALS — BP 110/80 | HR 70 | Ht 66.0 in | Wt 186.1 lb

## 2018-01-09 DIAGNOSIS — M5412 Radiculopathy, cervical region: Secondary | ICD-10-CM

## 2018-01-09 DIAGNOSIS — M4712 Other spondylosis with myelopathy, cervical region: Secondary | ICD-10-CM | POA: Diagnosis not present

## 2018-01-09 DIAGNOSIS — M5416 Radiculopathy, lumbar region: Secondary | ICD-10-CM

## 2018-01-09 MED ORDER — ZOLPIDEM TARTRATE 5 MG PO TABS: 5.0000 mg | ORAL_TABLET | Freq: Every evening | ORAL | 3 refills | Status: DC | PRN

## 2018-01-09 MED ORDER — GABAPENTIN 100 MG PO CAPS: ORAL_CAPSULE | ORAL | 3 refills | Status: DC

## 2018-01-09 MED ORDER — BACLOFEN 10 MG PO TABS: 10.0000 mg | ORAL_TABLET | Freq: Every day | ORAL | 3 refills | Status: DC

## 2018-01-09 NOTE — Patient Instructions (Signed)
1.  I changed gabapentin to 100mg  capsules.  May take 1 capsule in AM and afternoon and 3 capsules at bedtime as needed. 2.  Baclofen and Ambien also refilled. 3.  Continue rehab 4.  Follow up in one year
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
## 2018-03-07 ENCOUNTER — Encounter: Payer: Self-pay | Admitting: Neurology

## 2018-04-28 IMAGING — MG 2D DIGITAL SCREENING BILATERAL MAMMOGRAM WITH 3D TOMO WITH CAD
1 series · 2 of 5 positions shown · non-contrast
Comparison: Previous exam(s).

CLINICAL DATA: Screening.

EXAM:
2D DIGITAL SCREENING BILATERAL MAMMOGRAM WITH 3D TOMO WITH CAD

[R MLO tomo · 2 of 73 frames shown]
[frame 24/73]
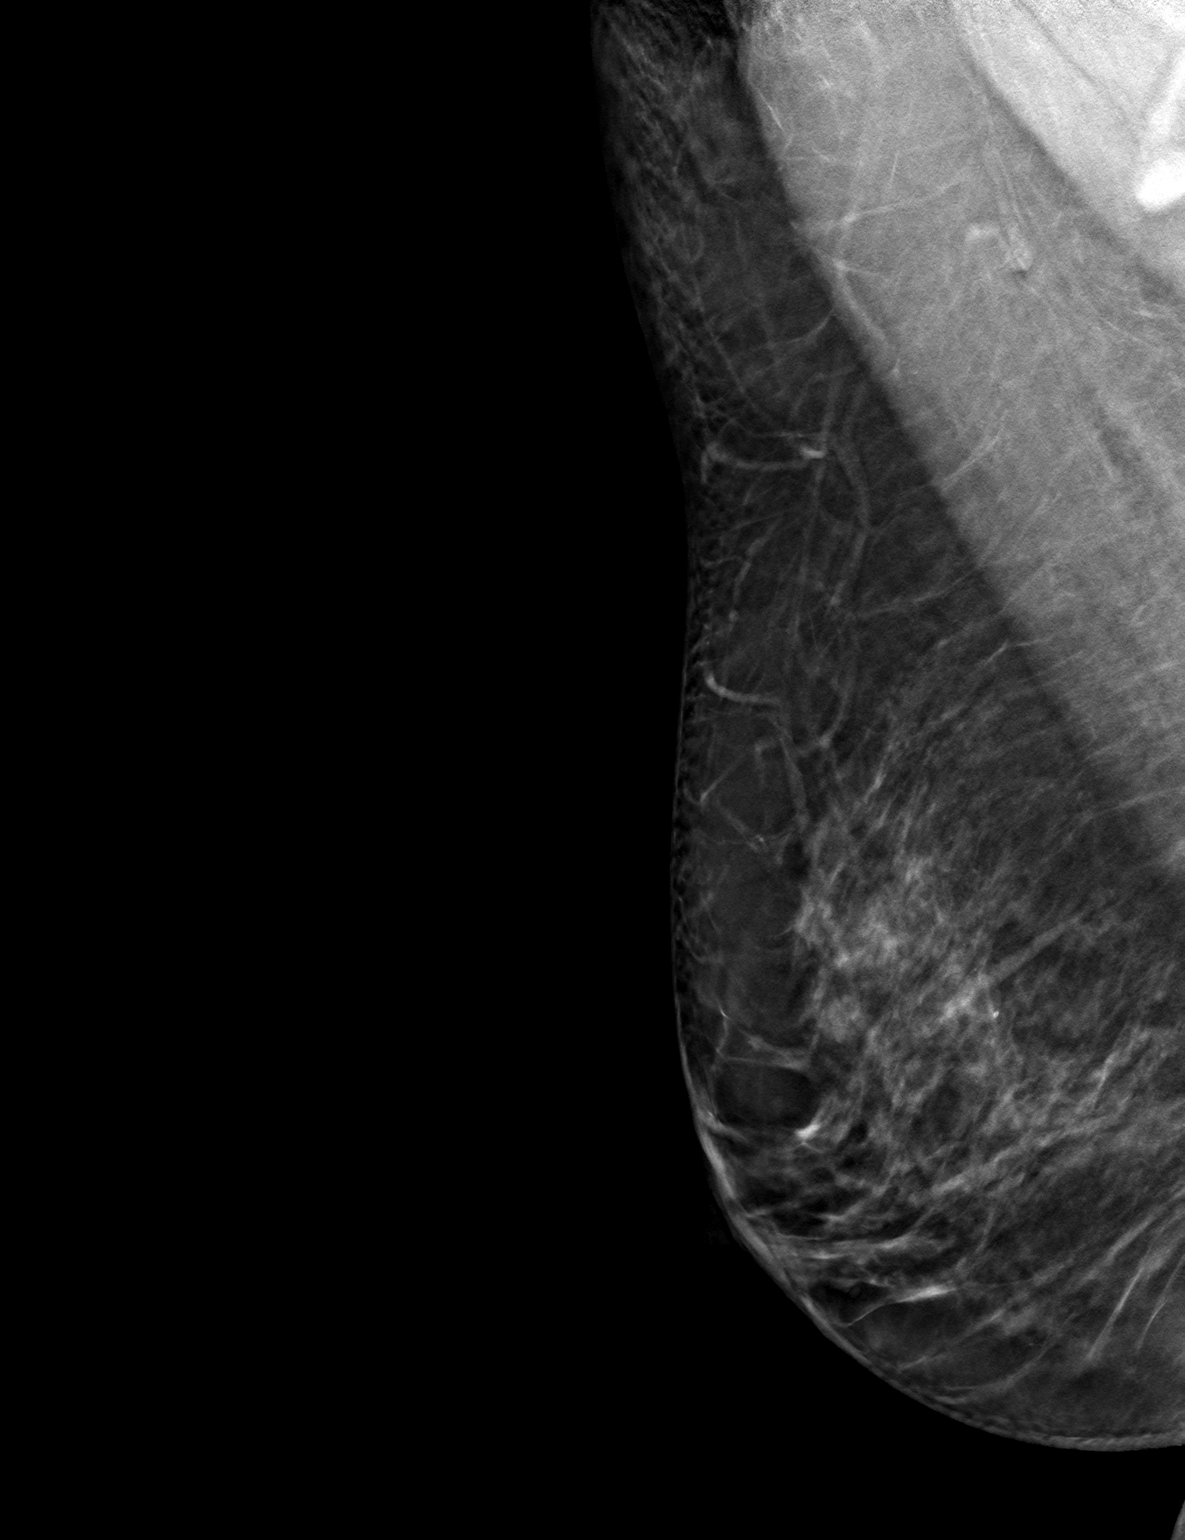
[frame 37/73]
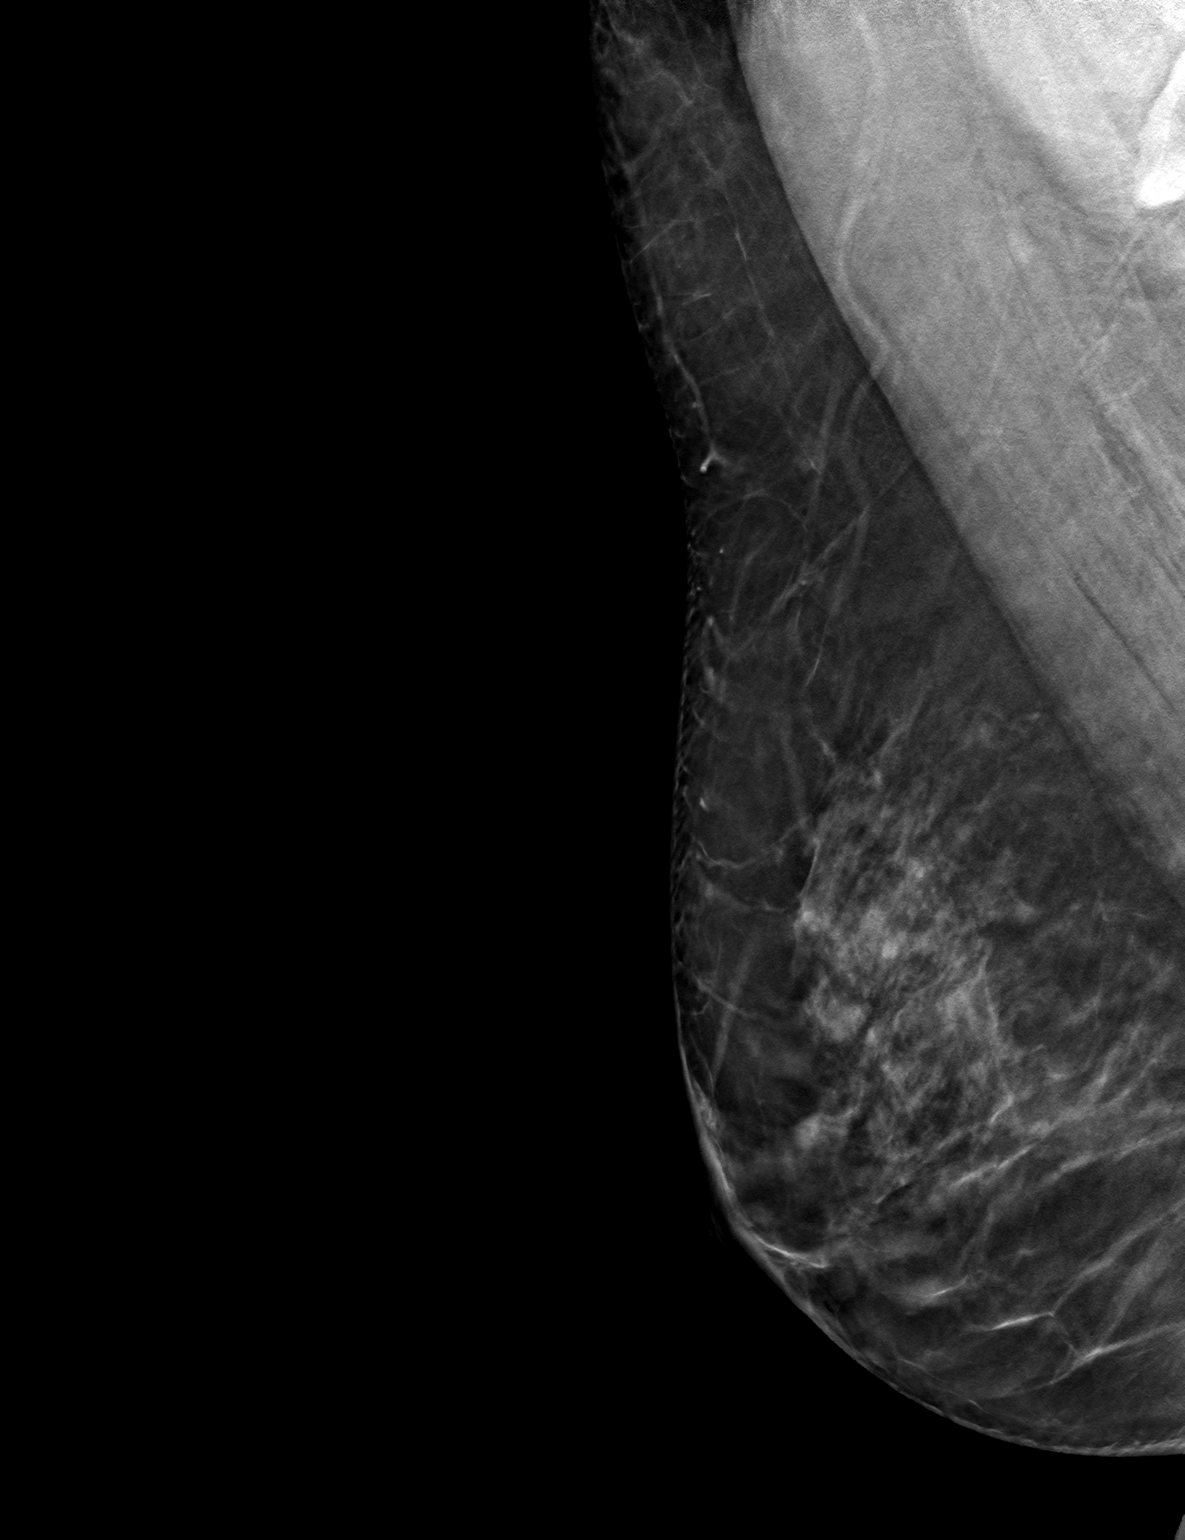

[2 of 5 positions shown; findings below may reference images not displayed]

ACR Breast Density Category b: There are scattered areas of
fibroglandular density.
FINDINGS: There are no findings suspicious for malignancy. Images were
processed with CAD.
IMPRESSION: No mammographic evidence of malignancy. A result letter of this
screening mammogram will be mailed directly to the patient.

RECOMMENDATION:
Screening mammogram in one year. (Code:[id number]-ZS0)

BI-RADS CATEGORY  1: Negative.

## 2018-05-15 DIAGNOSIS — G4701 Insomnia due to medical condition: Secondary | ICD-10-CM | POA: Diagnosis not present

## 2018-05-15 DIAGNOSIS — E559 Vitamin D deficiency, unspecified: Secondary | ICD-10-CM | POA: Diagnosis not present

## 2018-05-15 DIAGNOSIS — E7800 Pure hypercholesterolemia, unspecified: Secondary | ICD-10-CM | POA: Diagnosis not present

## 2018-07-27 DIAGNOSIS — Z23 Encounter for immunization: Secondary | ICD-10-CM | POA: Diagnosis not present

## 2018-08-12 ENCOUNTER — Other Ambulatory Visit: Payer: Self-pay | Admitting: Neurology

## 2018-10-23 ENCOUNTER — Other Ambulatory Visit: Payer: Self-pay | Admitting: Internal Medicine

## 2018-10-23 DIAGNOSIS — Z1231 Encounter for screening mammogram for malignant neoplasm of breast: Secondary | ICD-10-CM

## 2018-11-03 ENCOUNTER — Telehealth: Payer: Self-pay | Admitting: Neurology

## 2018-11-03 NOTE — Telephone Encounter (Signed)
Called and spoke with Lattie Haw, gave verbal for Baclofen refill

## 2018-11-03 NOTE — Telephone Encounter (Signed)
Merrilee Seashore called from [facility] regarding this patient and her Baclofen 10 MG. Reference number [id number]. Please Call. Thanks

## 2018-11-16 DIAGNOSIS — Z23 Encounter for immunization: Secondary | ICD-10-CM | POA: Diagnosis not present

## 2018-11-16 DIAGNOSIS — E559 Vitamin D deficiency, unspecified: Secondary | ICD-10-CM | POA: Diagnosis not present

## 2018-11-16 DIAGNOSIS — E7800 Pure hypercholesterolemia, unspecified: Secondary | ICD-10-CM | POA: Diagnosis not present

## 2018-11-16 DIAGNOSIS — M4712 Other spondylosis with myelopathy, cervical region: Secondary | ICD-10-CM | POA: Diagnosis not present

## 2018-11-16 DIAGNOSIS — Z0000 Encounter for general adult medical examination without abnormal findings: Secondary | ICD-10-CM | POA: Diagnosis not present

## 2018-11-29 ENCOUNTER — Ambulatory Visit
Admission: RE | Admit: 2018-11-29 | Discharge: 2018-11-29 | Disposition: A | Discharge status: Home / Self Care | Payer: 59 | Source: Ambulatory Visit | Attending: Internal Medicine | Admitting: Internal Medicine

## 2018-11-29 DIAGNOSIS — Z1231 Encounter for screening mammogram for malignant neoplasm of breast: Secondary | ICD-10-CM
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
## 2019-01-10 ENCOUNTER — Encounter: Payer: Self-pay | Admitting: Neurology

## 2019-01-10 ENCOUNTER — Ambulatory Visit: Payer: 59 | Admitting: Neurology

## 2019-01-10 VITALS — BP 108/72 | HR 71 | Ht 66.0 in | Wt 187.0 lb

## 2019-01-10 DIAGNOSIS — M4712 Other spondylosis with myelopathy, cervical region: Secondary | ICD-10-CM

## 2019-01-10 DIAGNOSIS — M5416 Radiculopathy, lumbar region: Secondary | ICD-10-CM

## 2019-01-10 DIAGNOSIS — M5412 Radiculopathy, cervical region: Secondary | ICD-10-CM

## 2019-01-10 NOTE — Patient Instructions (Signed)
1.  Continue baclofen, gabapentin, and Ambien 2.  Follow up in one year or as needed.

## 2019-01-16 ENCOUNTER — Other Ambulatory Visit: Payer: Self-pay | Admitting: Neurology

## 2019-01-17 ENCOUNTER — Other Ambulatory Visit: Payer: Self-pay | Admitting: Neurology

## 2019-01-17 DIAGNOSIS — D2261 Melanocytic nevi of right upper limb, including shoulder: Secondary | ICD-10-CM | POA: Diagnosis not present

## 2019-01-17 DIAGNOSIS — D2262 Melanocytic nevi of left upper limb, including shoulder: Secondary | ICD-10-CM | POA: Diagnosis not present

## 2019-01-17 DIAGNOSIS — D225 Melanocytic nevi of trunk: Secondary | ICD-10-CM | POA: Diagnosis not present

## 2019-01-17 MED ORDER — ZOLPIDEM TARTRATE 5 MG PO TABS: ORAL_TABLET | ORAL | 1 refills | Status: DC

## 2019-03-21 DIAGNOSIS — Z23 Encounter for immunization: Secondary | ICD-10-CM | POA: Diagnosis not present

## 2019-05-04 IMAGING — MG DIGITAL SCREENING BILATERAL MAMMOGRAM WITH TOMO AND CAD
8 series · 9 of 24 positions shown · non-contrast
Comparison: Previous exam(s).

CLINICAL DATA: Screening.

EXAM:
DIGITAL SCREENING BILATERAL MAMMOGRAM WITH TOMO AND CAD

[L CC synth-2D]
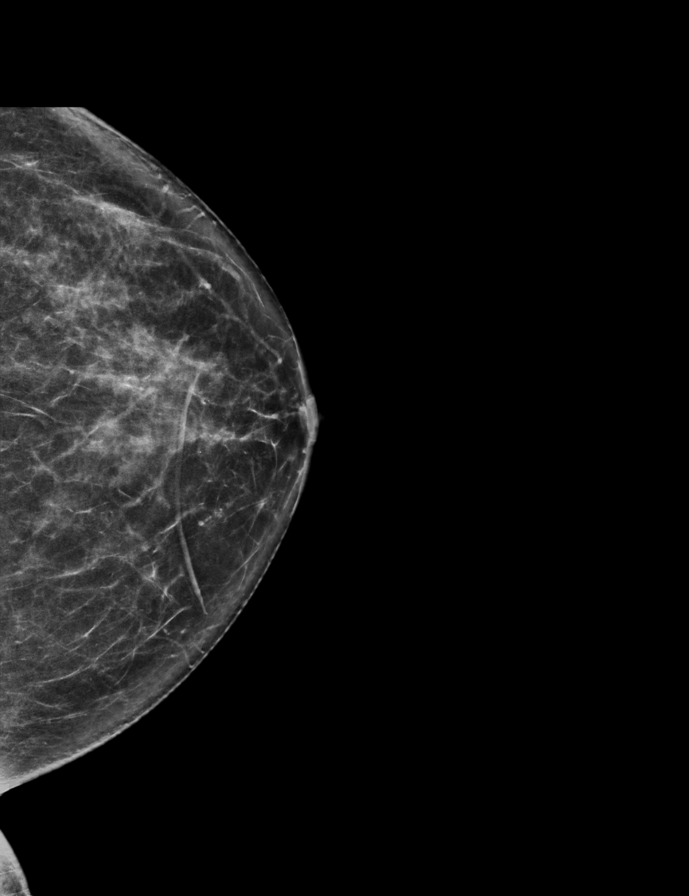

[L MLO synth-2D]
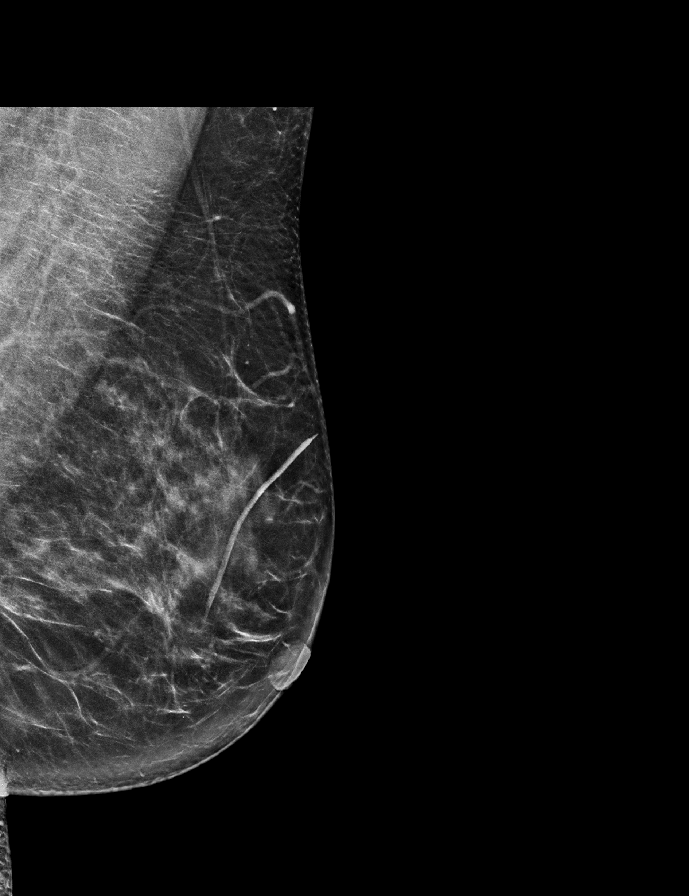

[R CC synth-2D]
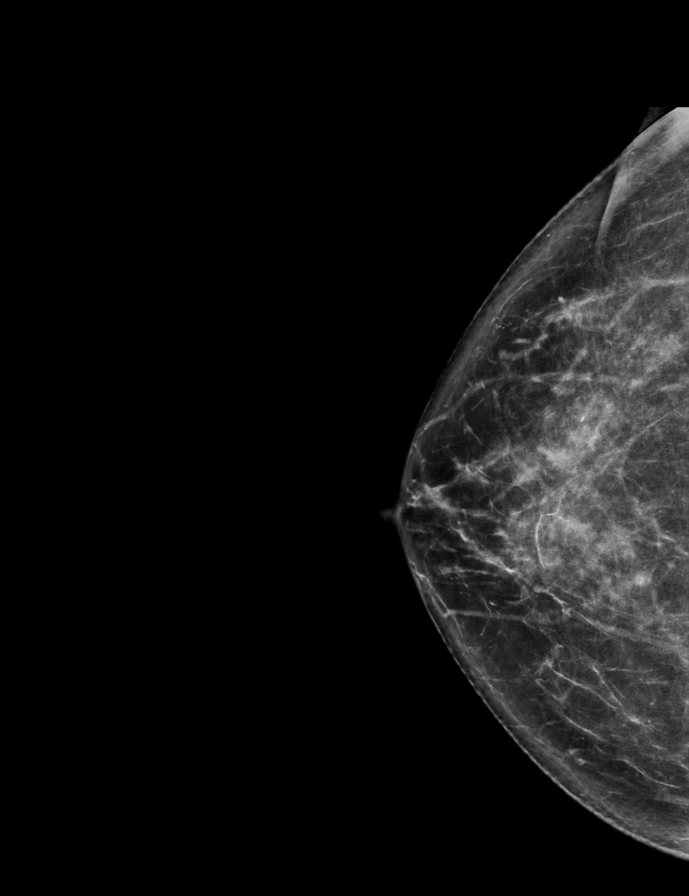

[R MLO synth-2D]
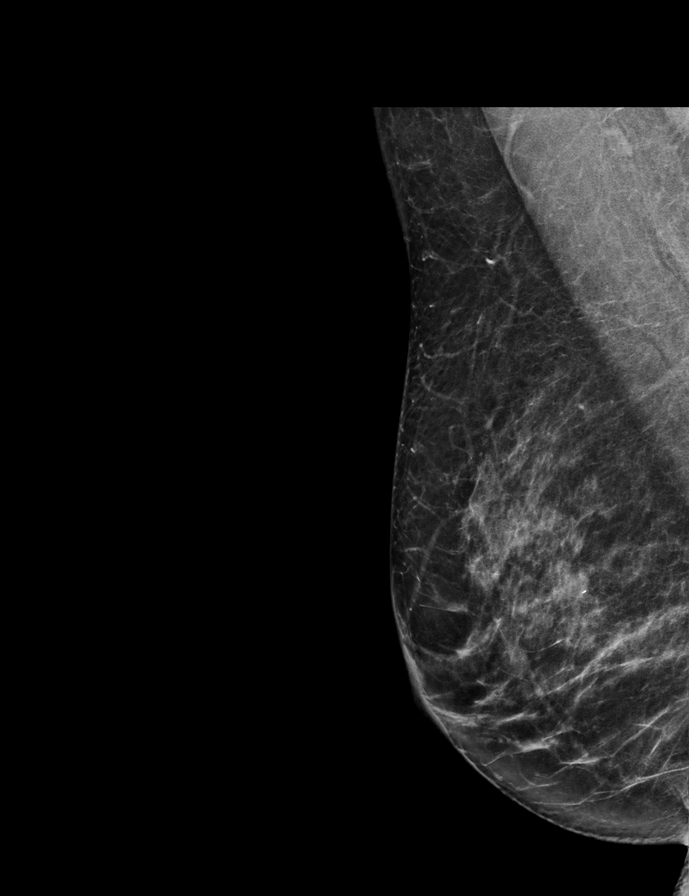

[R CC tomo · 2 of 69 frames shown]
[frame 23/69]
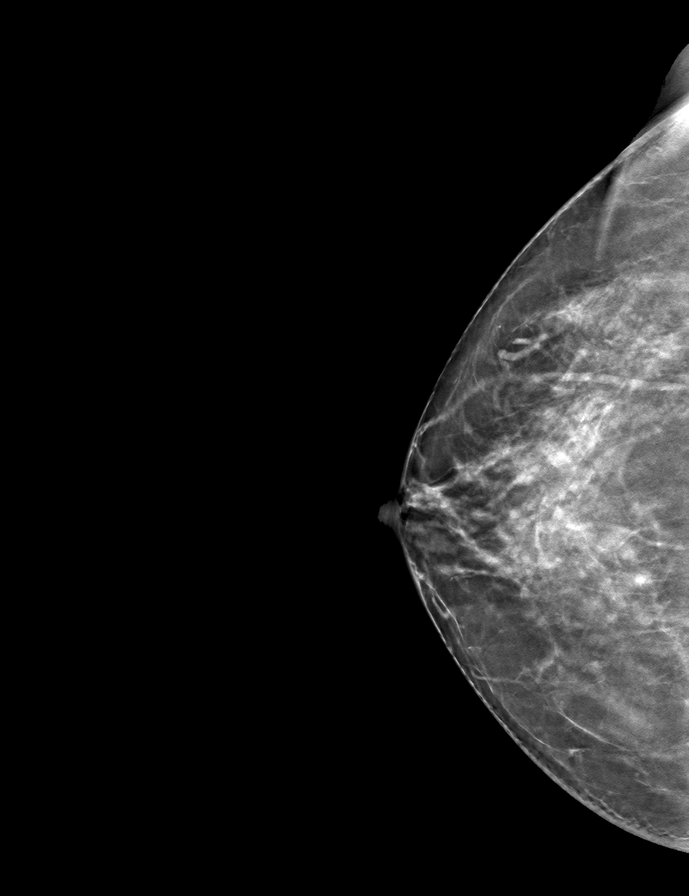
[frame 35/69]
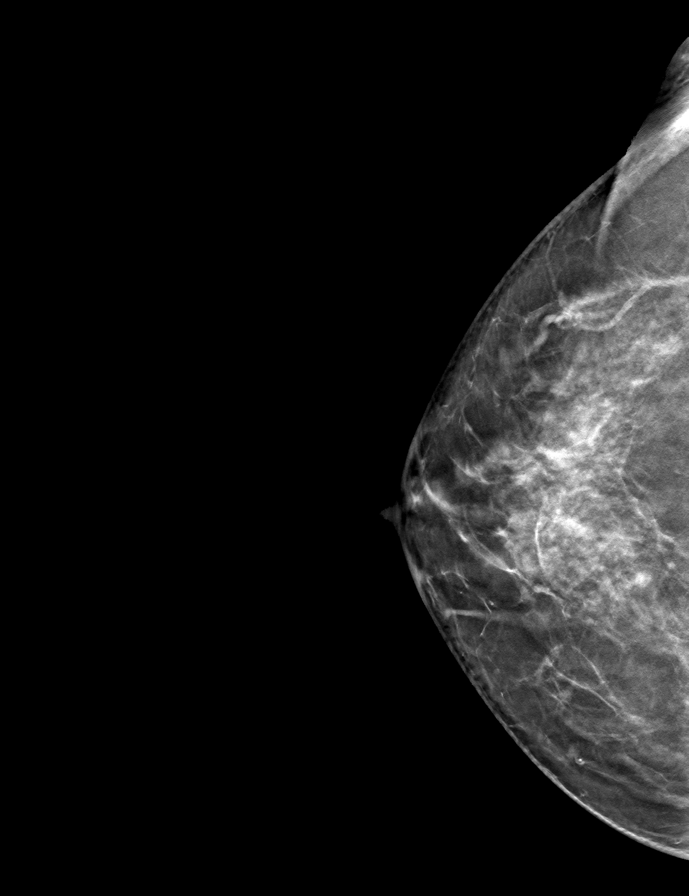

[L MLO tomo · tomo slice 35/70.0]
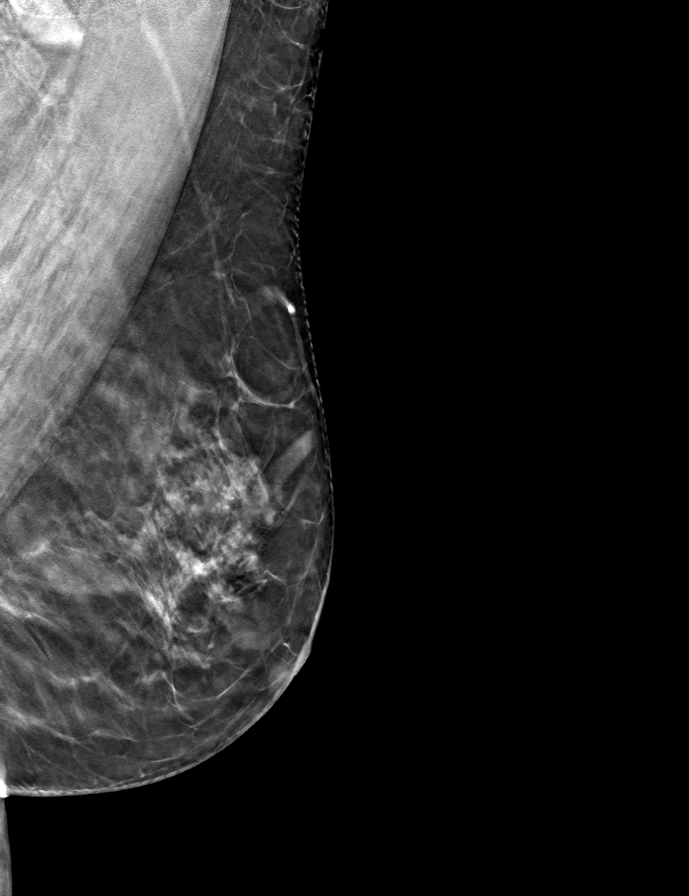

[L CC tomo · tomo slice 33/65.0]
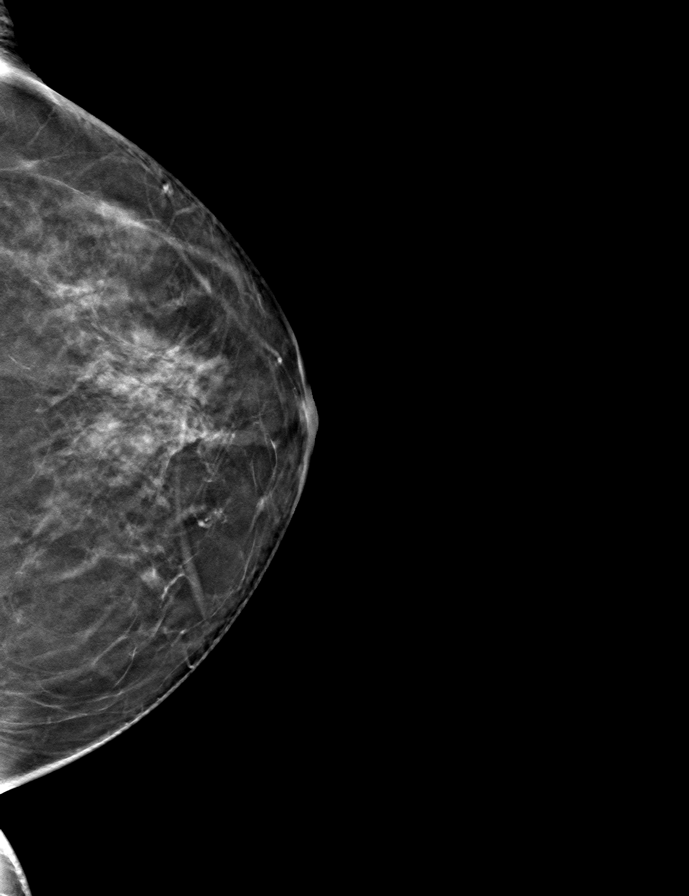

[R MLO tomo · tomo slice 35/69.0]
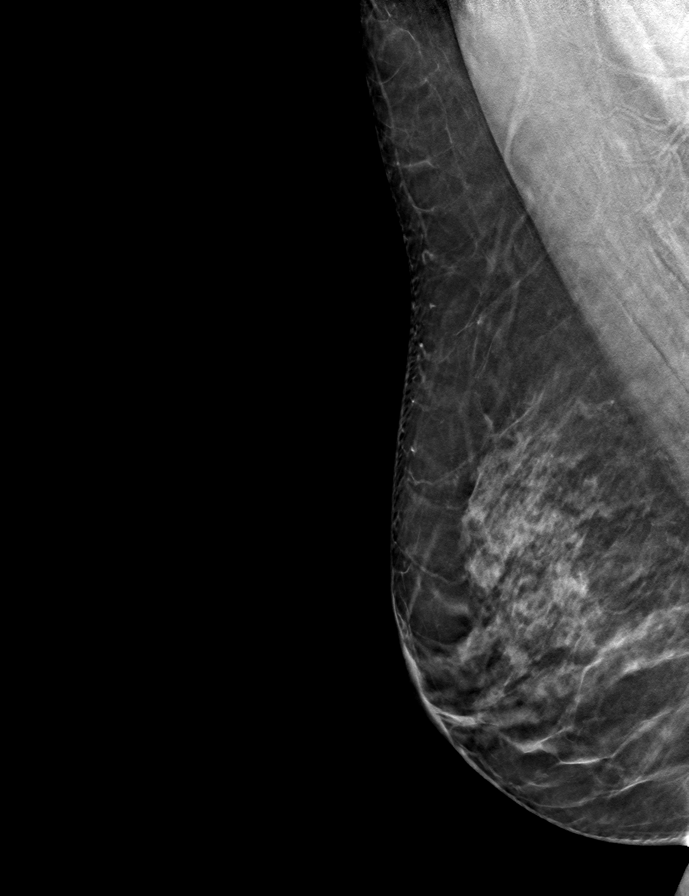

[9 of 24 positions shown; findings below may reference images not displayed]

ACR Breast Density Category c: The breast tissue is heterogeneously
dense, which may obscure small masses.
FINDINGS: There are no findings suspicious for malignancy. Images were
processed with CAD.
IMPRESSION: No mammographic evidence of malignancy. A result letter of this
screening mammogram will be mailed directly to the patient.

RECOMMENDATION:
Screening mammogram in one year. (Code:[id number])

BI-RADS CATEGORY  1: Negative.

## 2019-06-12 ENCOUNTER — Telehealth: Payer: Self-pay | Admitting: Neurology

## 2019-06-12 NOTE — Telephone Encounter (Signed)
Pt states she is checking the status of her disability paper work they have not received anything from our office and benefits are going to stop please call

## 2019-06-12 NOTE — Telephone Encounter (Signed)
Dr Tomi Likens has these papers. Will call Pt.

## 2019-06-22 ENCOUNTER — Telehealth: Payer: Self-pay | Admitting: Neurology

## 2019-06-22 NOTE — Telephone Encounter (Signed)
Patient left VM about disability paperwork and wanting to make sure they got completed. Please call her back. Thanks!

## 2019-06-22 NOTE — Telephone Encounter (Signed)
Called and LMOVM advising Pt the disability paperwork was faxed to [employer] on 06/18/19, and we received confirmation it went through successfully.  I advised her I also have th original papers and if she would like me to mail them to her, she can call or send a message via My Chart.

## 2019-08-13 ENCOUNTER — Telehealth: Payer: Self-pay | Admitting: Neurology

## 2019-08-13 NOTE — Telephone Encounter (Signed)
Patient left vm- has changed [facility]ies to [facility] for the refill on Ambien 5 mg, baclofen 10 mg, gabapentin 100 mg - 90 day supply on all. Phone # is [phone].  Thanks!

## 2019-08-14 ENCOUNTER — Other Ambulatory Visit: Payer: Self-pay | Admitting: Neurology

## 2019-08-14 MED ORDER — ZOLPIDEM TARTRATE 5 MG PO TABS: ORAL_TABLET | ORAL | 1 refills | Status: DC

## 2019-08-14 MED ORDER — BACLOFEN 10 MG PO TABS: 10.0000 mg | ORAL_TABLET | Freq: Every day | ORAL | 3 refills | Status: DC

## 2019-08-14 MED ORDER — GABAPENTIN 100 MG PO CAPS: ORAL_CAPSULE | ORAL | 3 refills | Status: DC

## 2019-08-14 NOTE — Telephone Encounter (Signed)
Sent 90 day supply of Ambien, baclofen and gabapentin to [facility]

## 2020-01-18 NOTE — Progress Notes (Signed)
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  UPDATE: She takes:  Baclofen 10mg  at bedtime as needed and Aleve for muscle pain Gabapentin 100mg  at night PRN She takes Ambien 5mg  at bedtime to help sleep She rarely takes Aleve.   She has had flare up of neck pain once in awhile.  Due to the pandemic, she has not been going to the gym but walks.  She has since moved to [facility].  HISTORY: In May 1999, she began experiencing left shoulder pain with left upper extremity numbness and weakness. On 03/30/98, an MRI of the cervical spine revealed a large superiorly extruded cervical disc at C6-7 compress the cervical cord and C7 radiculopathy, small disc protrusion at C2-3 and C5-6. She was evaluated by an orthopedic surgeon who scheduled her for a left C6-7 foraminotomy and laminotomy on 04-02-98. The day before the surgery, she developed bilateral leg weakness, difficulty walking, and urinary retention. Steroids were held as she was going to have the surgery the following day anyway. She continued to have profound weakness of the lower extremities. An MRI of the cervical spine performed on 04/04/98 showed persistent central and left paramedial disc herniation at C6-7, with possible increased cord compression. Neurosurgery was consulted and she underwent C6-7 anteriorcervical discectomy, fusion and instrumentation.She underwent PT and was able to start ambulating.She return to work, only attending to nonsurgical clinic visits in August.In May of 2000, she began having right leg weakness and numbness, as well as low back pain. MRI of the cervical spine performed on 04/08/99 showed  small disc protrusion at C5-6 above C6-7 fusion, with scarring along the posterior margin of the C6-7 left of midline. MRI of the lumbar spine performed 04/13/99 showed degenerative disc disease at L5-S1 with grade 1 spondylolisthesis of L5 on S1, bilateral L5 spondylolyses. In the summer of 2000, she began having right arm weakness numbness and severe pain. On 10/12/99, she was admitted for IV steroids. MRI of the cervical spine showed larger disc protrusion at C5-6 to the right side, slightly more prominent left-sided disc protrusion at C2-3, with scarring on the left side of C6-7 level. On 10/20/99, she underwent a C5-6 anterior cervical discectomy and fusion, anterior instrumentation C5-7, and removal of the previous anterior plate. In 2001, she had worsening of right leg weakness and numbness, with foot drop. On 04/12/00, she underwent laminectomy of L5 with bilateral L5-S1 facetectomies and foraminotomies with interbody carbon fiber cage fusion of L5-S1 with pedicle screws and plates from [id number]-GRM with left iliac crest bone graft harvest. She later got a second opinion by another neurosurgeon in August 2002, who believed that the lumbar failed to fuse. A PanMyelogram was performed, which she was told revealed arachnoiditis.She was referred to another neurosurgeon in [place] in 2003. MVC-EMG was performed on 03/01/02, which revealed chronic right L4 radiculopathy and chronic left L4-5 radiculopathy. He recommended an anterior abdominal surgery to fuse the lumbar L4-S1 area. 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  Prior therapies tried:hard back brace, bone stimulator which failed to fuse the lumbar region, TENS unit (caused increased discomfort), physical therapy (aggravated it) Prior medications:baclofen 10mg  three times daily, Skelaxin 800mg ,  prednisone taper, Vioxx (GI upset), ibuprofen.She has used narcotics in the past, but  chooses to avoid them at this time. Current medications include:baclofen 10mg  twice daily, naproxen sodium (Anaprox) and Ambien 5mg  as needed..  She is on permanent disability as she cannot work.She continues to experience pain in the neck aggravated by any movement.She has significant limited neck movement.There is no radicular pain down the arms.Symptoms are worse at night and she often requires Ambien 5mg .She experiences intermittent numbness of the right leg with any movement as well as discomfort in the feet and legs causing difficulty falling asleep at night.During the day, she has to constantly change position.She is able to drive, usually on cruise control since she has difficulty operating with her right foot.She is able to perform light housework but cannot partake in any moderate or heavy lifting.Raising her arms causes aggravation of neck and shoulder pain.She is able to write with a pen briefly.She is able to use utensils.She usually will drop items.She has fallen down on occasion, particularly on the stairs.She feels it is because her right leg will give out.If she washes her hair in the shower with her eyes closed, she will become off-balance.She frequently has posterior headache radiating to the front and triggered by neck movement.She still has mild urinary retention and overflow in the mornings.She occasionally has constipation but no incontinence.  PAST MEDICAL HISTORY: Past Medical History:  Diagnosis Date  . Back pain   . GERD (gastroesophageal reflux disease)   . Menopause   . Neck pain     MEDICATIONS: Current Outpatient Medications on File Prior to Visit  Medication Sig Dispense Refill  . AMBULATORY NON FORMULARY MEDICATION Medication Name   [place] 1 Device 0  . baclofen (LIORESAL) 10 MG tablet Take 1 tablet (10 mg total) by mouth at bedtime. 90 tablet 3  . estradiol (ESTRACE VAGINAL) 0.1 MG/GM vaginal cream Insert one gram  vaginally two times per week 42.5 g 2  . famotidine (PEPCID) 40 MG tablet TAKE 1 TABLET DAILY 90 tablet 2  . gabapentin (NEURONTIN) 100 MG capsule TAKE 1 CAPSULE IN THE      MORNING, 1 CAPSULE IN THE  AFTERNOON, AND 3 CAPSULES  IN THE EVENING AS NEEDED 450 capsule 3  . MULTIPLE VITAMIN PO Take 1 tablet by mouth daily.    . naproxen sodium (ANAPROX) 220 MG tablet Take 1 tablet (220 mg total) by mouth 2 (two) times daily with a meal. 180 tablet 3  . Vitamin D, Ergocalciferol, (DRISDOL) 50000 units CAPS capsule 1 capsule po q 14 days. 6 capsule 0  . zolpidem (AMBIEN) 5 MG tablet TAKE ONE TABLET AT BEDTIME AS NEEDED FOR SLEEP 90 tablet 1   No current facility-administered medications on file prior to visit.    ALLERGIES: No Known Allergies  FAMILY HISTORY: Family History  Problem Relation Age of Onset  . Cancer Maternal Grandmother 49       breast--dec age 32  . Breast cancer Maternal Grandmother   . Cancer Father        skin cancer  . Hypertension Father   . Thyroid disease Mother        hypothyroid  . CAD Brother   . Ovarian cancer Other    SOCIAL HISTORY: Social History   Socioeconomic History  . Marital status: Married    Spouse name: Not on file  . Number of children: Not on file  . Years of education: MD  . Highest education level: Not on file  Occupational History  . Occupation: Retired    Comment: OBGYN MD  Tobacco Use  . Smoking status: Never Smoker  . Smokeless tobacco: Never Used  Substance and Sexual Activity  . Alcohol use: Yes    Alcohol/week: 4.0 standard drinks    Types: 4 Glasses of wine per week    Comment: week  . Drug use: No  . Sexual activity: Yes    Partners: Male    Birth control/protection: Post-menopausal  Other Topics Concern  . Not on file  Social History Narrative  . Not on file   Social Determinants of Health   Financial Resource Strain:   . Difficulty of Paying Living Expenses:   Food Insecurity:   . Worried About Sales executive in the Last Year:   . Arboriculturist in the Last Year:   Transportation Needs:   . Film/video editor (Medical):   Marland Kitchen Lack of Transportation (Non-Medical):   Physical Activity:   . Days of Exercise per Week:   . Minutes of Exercise per Session:   Stress:   . Feeling of Stress :   Social Connections:   . Frequency of Communication with Friends and Family:   . Frequency of Social Gatherings with Friends and Family:   . Attends Religious Services:   . Active Member of Clubs or Organizations:   . Attends Archivist Meetings:   Marland Kitchen Marital Status:   Intimate Partner Violence:   . Fear of Current or Ex-Partner:   . Emotionally Abused:   Marland Kitchen Physically Abused:   . Sexually Abused:     REVIEW OF SYSTEMS: Constitutional: No fevers, chills, or sweats, no generalized fatigue, change in appetite Eyes: No visual changes, double vision, eye pain Ear, nose and throat: No hearing loss, ear pain, nasal congestion, sore throat Cardiovascular: No chest pain, palpitations Respiratory:  No shortness of breath at rest or with exertion, wheezes GastrointestinaI: No nausea, vomiting, diarrhea, abdominal pain, fecal incontinence Genitourinary:  No dysuria, urinary retention or frequency Musculoskeletal:  No neck pain, back pain Integumentary: No rash, pruritus, skin lesions Neurological: as above Psychiatric: No depression, insomnia, anxiety Endocrine: No palpitations, fatigue, diaphoresis, mood swings, change in appetite, change in weight, increased thirst Hematologic/Lymphatic:  No purpura, petechiae. Allergic/Immunologic: no itchy/runny eyes, nasal congestion, recent allergic reactions, rashes  PHYSICAL EXAM: Blood pressure 135/80, pulse 61, height 5\' 6"  (1.676 m), weight 190 lb 6.4 oz (86.4 kg), last menstrual period 11/08/2009, SpO2 99 %. General: No acute distress.  Patient appears well-groomed.   Head:  Normocephalic/atraumatic Eyes:  Fundi examined but not visualized Neck:  supple, no paraspinal tenderness, full range of motion Heart:  Regular rate and rhythm Lungs:  Clear to auscultation bilaterally Back: No paraspinal tenderness Neurological Exam: alert and oriented to person, place, and time. Attention span and concentration intact, recent and remote memory intact, fund of knowledge intact.  Speech fluent and not dysarthric, language intact.  CN II-XII intact. Bulk and tone normal.  Muscle strength 5-/5 left hand grip, 4+/5 in both triceps, 4+/5 right lower extremity, otherwise 5/5.  Sensation to pinprick slightly reduced in right lower extremity.  Otherwise, pinprick and vibration sensation intact.  Deep tendon reflexes 2+ throughout, toes downgoing.  Finger to nose testing intact.  Gait with slight right limp.  Romberg with sway  IMPRESSION: 1.  Chronic cervical myelopathy with radiculopathy 2.  Chronic lumbar radiculopathy  PLAN: 1. Baclofen, gabapentin, Ambien  2.  Follow up in one year  Kristen Clines, DO

## 2020-01-21 ENCOUNTER — Encounter: Payer: Self-pay | Admitting: Neurology

## 2020-01-21 ENCOUNTER — Ambulatory Visit (INDEPENDENT_AMBULATORY_CARE_PROVIDER_SITE_OTHER): Payer: Medicare Other | Admitting: Neurology

## 2020-01-21 ENCOUNTER — Other Ambulatory Visit: Payer: Self-pay

## 2020-01-21 VITALS — BP 135/80 | HR 61 | Ht 66.0 in | Wt 190.4 lb

## 2020-01-21 DIAGNOSIS — M4712 Other spondylosis with myelopathy, cervical region: Secondary | ICD-10-CM

## 2020-01-21 DIAGNOSIS — M5412 Radiculopathy, cervical region: Secondary | ICD-10-CM

## 2020-01-21 DIAGNOSIS — M5416 Radiculopathy, lumbar region: Secondary | ICD-10-CM | POA: Diagnosis not present

## 2020-01-21 NOTE — Patient Instructions (Signed)
No change in medication Follow up in one year

## 2020-04-18 ENCOUNTER — Other Ambulatory Visit: Payer: Self-pay | Admitting: Neurology

## 2020-05-26 LAB — COLOGUARD: COLOGUARD: NEGATIVE

## 2020-08-28 NOTE — Progress Notes (Signed)
Disability forms filled out and fax to [employer]

## 2020-08-31 ENCOUNTER — Other Ambulatory Visit: Payer: Self-pay | Admitting: Neurology

## 2020-09-02 ENCOUNTER — Telehealth: Payer: Self-pay | Admitting: Neurology

## 2020-09-02 NOTE — Telephone Encounter (Signed)
I had given it to [facility]

## 2020-09-02 NOTE — Telephone Encounter (Signed)
Patient called to check on the status of her disability forms.

## 2020-09-03 NOTE — Telephone Encounter (Signed)
Pt advised Forms faxed 08/28/20

## 2021-01-19 NOTE — Progress Notes (Signed)
NEUROLOGY FOLLOW UP OFFICE NOTE  [name], MD [id number]  Assessment/Plan:   1.  Chronic cervical myelopathy with radiculopathy 2.  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  Baclofen, gabapentin, Ambien 2.  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  UPDATE: Intermittent neck and back pain flares unchanged.  In November, she fell down 6 steps and hit her head.  No loss of consciousness.  She is back in the gym.  She tried getting off Ambien but she was unable to sleep with decreased dose.  Denies depression and anxiety.  She also reports itching in her mid-thoracic region.  No rash.  Saw dermatology who suspected a nerve issues.  She has Dupuytren's arthritis which sometimes inflames her hands.   She takes:  Baclofen 10mg  at bedtime as needed and Aleve for muscle pain Gabapentin100mg  at night PRN She takes Ambien 5mg  at bedtime to help sleep She rarely takes Aleve.    HISTORY: In May 1999, she began experiencing left shoulder pain with left upper extremity numbness and weakness. On 03/30/98, an MRI of the cervical spine revealed a large superiorly extruded cervical disc at C6-7 compress the cervical cord and C7 radiculopathy, small disc protrusion at C2-3 and C5-6. She was evaluated by an orthopedic surgeon who scheduled her for a left C6-7 foraminotomy and laminotomy on 04-02-98. The day before the surgery, she developed bilateral leg weakness, difficulty walking, and urinary retention. Steroids were held as she was going to have the surgery the following day anyway. She continued to have profound weakness of the lower extremities. An MRI of the cervical spine performed on 04/04/98 showed  persistent central and left paramedial disc herniation at C6-7, with possible increased cord compression. Neurosurgery was consulted and she underwent C6-7 anteriorcervical discectomy, fusion and instrumentation.She underwent PT and was able to start ambulating.She return to work, only attending to nonsurgical clinic visits in August.In May of 2000, she began having right leg weakness and numbness, as well as low back pain. MRI of the cervical spine performed on 04/08/99 showed small disc protrusion at C5-6 above C6-7 fusion, with scarring along the posterior margin of the C6-7 left of midline. MRI of the lumbar spine performed 04/13/99 showed degenerative disc disease at L5-S1 with grade 1 spondylolisthesis of L5 on S1, bilateral L5 spondylolyses. In the summer of 2000, she began having right arm weakness numbness and severe pain. On 10/12/99, she was admitted for IV steroids. MRI of the cervical spine showed larger disc protrusion at C5-6 to the right side, slightly more prominent left-sided disc protrusion at C2-3, with scarring on the left side of C6-7 level. On 10/20/99, she underwent a C5-6 anterior cervical discectomy and fusion, anterior instrumentation C5-7, and removal of the previous anterior plate. In 2001, she had worsening of right leg weakness and numbness, with foot drop. On 04/12/00, she underwent laminectomy of L5 with bilateral L5-S1 facetectomies and foraminotomies with interbody carbon fiber cage fusion of L5-S1 with pedicle screws and plates from N5-A2 with left iliac crest bone graft harvest. She later got a second opinion by another neurosurgeon in August 2002, who believed that the lumbar failed to fuse. A PanMyelogram was performed, which she was told revealed arachnoiditis.She was referred to another neurosurgeon in [place] in 2003. [place]  was performed on 03/01/02, which revealed chronic right L4 radiculopathy and chronic left L4-5 radiculopathy. He recommended an anterior abdominal  surgery to fuse the lumbar L4-S1 area. 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  Prior therapies tried:hard back brace, bone stimulator which failed to fuse the lumbar region, TENS unit (caused increased discomfort), physical therapy (aggravated it) Prior medications:baclofen 10mg  three times daily, Skelaxin 800mg , prednisone taper, Vioxx (GI upset), ibuprofen.She has used narcotics in the past, but chooses to avoid them at this time. Current medications include:baclofen 10mg  twice daily, naproxen sodium (Anaprox) and Ambien 5mg  as needed..  She is on permanent disability as she cannot work.She continues to experience pain in the neck aggravated by any movement.She has significant limited neck movement.There is no radicular pain down the arms.Symptoms are worse at night and she often requires Ambien 5mg .She experiences intermittent numbness of the right leg with any movement as well as discomfort in the feet and legs causing difficulty falling asleep at night.During the day, she has to constantly change position.She is able to drive, usually on cruise control since she has difficulty operating with her right foot.She is able to perform light housework but cannot partake in any moderate or heavy lifting.Raising her arms causes aggravation of neck and shoulder pain.She is able to write with a pen briefly.She is able to use utensils.She usually will drop items.She has fallen down on occasion, particularly on the stairs.She feels it is because her right leg will give out.If she washes her hair in the shower with her eyes closed, she will become off-balance.She frequently has posterior headache radiating to the front and triggered by neck movement.She still has mild urinary retention and overflow in the mornings.She occasionally has constipation but no incontinence.  PAST MEDICAL  HISTORY: Past Medical History:  Diagnosis Date  . Back pain   . GERD (gastroesophageal reflux disease)   . Menopause   . Neck pain     MEDICATIONS: Current Outpatient Medications on File Prior to Visit  Medication Sig Dispense Refill  . baclofen (LIORESAL) 10 MG tablet TAKE 1 TABLET BY MOUTH AT BEDTIME 90 tablet 2  . estradiol (ESTRACE VAGINAL) 0.1 MG/GM vaginal cream Insert one gram vaginally two times per week 42.5 g 2  . famotidine (PEPCID) 40 MG tablet TAKE 1 TABLET DAILY 90 tablet 2  . gabapentin (NEURONTIN) 100 MG capsule TAKE 1 CAPSULE IN THE      MORNING, 1 CAPSULE IN THE  AFTERNOON, AND 3 CAPSULES  IN THE EVENING AS NEEDED 450 capsule 3  . MULTIPLE VITAMIN PO Take 1 tablet by mouth daily.    . naproxen sodium (ANAPROX) 220 MG tablet Take 1 tablet (220 mg total) by mouth 2 (two) times daily with a meal. 180 tablet 3  . Vitamin D, Ergocalciferol, (DRISDOL) 50000 units CAPS capsule 1 capsule po q 14 days. 6 capsule 0  . zolpidem (AMBIEN) 5 MG tablet TAKE 1 TABLET BY MOUTH EVERY DAY AT BEDTIME AS NEEDED FOR SLEEP 90 tablet 0   No current facility-administered medications on file prior to visit.    ALLERGIES: No Known Allergies  FAMILY HISTORY: Family History  Problem Relation Age of Onset  . Cancer Maternal Grandmother 45       breast--dec age 44  . Breast cancer Maternal Grandmother   . Cancer Father        skin cancer  . Hypertension Father   . Thyroid disease  Mother        hypothyroid  . CAD Brother   . Ovarian cancer Other       Objective:  Blood pressure 140/76, pulse 64, resp. rate 18, height 5\' 6"  (1.676 m), weight 187 lb (84.8 kg), last menstrual period 11/08/2009, SpO2 98 %. General: No acute distress.  Patient appears well-groomed.   Head:  Normocephalic/atraumatic Eyes:  Fundi examined but not visualized Neck: supple, no paraspinal tenderness, full range of motion Heart:  Regular rate and rhythm Lungs:  Clear to auscultation bilaterally Back: No  paraspinal tenderness Neurological Exam: alert and oriented to person, place, and time. Attention span and concentration intact, recent and remote memory intact, fund of knowledge intact.  Speech fluent and not dysarthric, language intact.  CN II-XII intact. Bulk and tone normal.  Muscle strength 5-/5 left hand grip, 4+/5 in both triceps, 4+/5 right lower extremity, otherwise 5/5.  Sensation to pinprick slightly reduced in right lower extremity.  Otherwise, pinprick and vibration sensation intact.  Deep tendon reflexes 2+ throughout, toes downgoing.  Finger to nose testing intact.  Gait with slight right limp.  Romberg with sway   Metta Clines, DO

## 2021-01-20 ENCOUNTER — Ambulatory Visit (INDEPENDENT_AMBULATORY_CARE_PROVIDER_SITE_OTHER): Payer: Medicare Other | Admitting: Neurology

## 2021-01-20 ENCOUNTER — Other Ambulatory Visit: Payer: Self-pay

## 2021-01-20 ENCOUNTER — Encounter: Payer: Self-pay | Admitting: Neurology

## 2021-01-20 VITALS — BP 140/76 | HR 64 | Resp 18 | Ht 66.0 in | Wt 187.0 lb

## 2021-01-20 DIAGNOSIS — R202 Paresthesia of skin: Secondary | ICD-10-CM | POA: Diagnosis not present

## 2021-01-20 DIAGNOSIS — M5412 Radiculopathy, cervical region: Secondary | ICD-10-CM

## 2021-01-20 DIAGNOSIS — M5416 Radiculopathy, lumbar region: Secondary | ICD-10-CM | POA: Diagnosis not present

## 2021-01-20 DIAGNOSIS — G959 Disease of spinal cord, unspecified: Secondary | ICD-10-CM

## 2021-01-20 DIAGNOSIS — G894 Chronic pain syndrome: Secondary | ICD-10-CM

## 2021-01-20 MED ORDER — BACLOFEN 10 MG PO TABS: 10.0000 mg | ORAL_TABLET | Freq: Every day | ORAL | 3 refills | Status: DC

## 2021-01-20 MED ORDER — ZOLPIDEM TARTRATE 5 MG PO TABS: ORAL_TABLET | ORAL | 0 refills | Status: DC

## 2021-01-20 MED ORDER — GABAPENTIN 100 MG PO CAPS: ORAL_CAPSULE | ORAL | 3 refills | Status: DC

## 2021-01-20 NOTE — Patient Instructions (Signed)
Refilled: Baclofen Gabapentin Ambien

## 2021-01-28 ENCOUNTER — Other Ambulatory Visit: Payer: Self-pay | Admitting: Neurology

## 2021-01-29 ENCOUNTER — Other Ambulatory Visit: Payer: Self-pay | Admitting: Neurology

## 2021-02-02 ENCOUNTER — Other Ambulatory Visit: Payer: Self-pay

## 2021-02-02 ENCOUNTER — Telehealth: Payer: Self-pay | Admitting: Neurology

## 2021-02-02 ENCOUNTER — Other Ambulatory Visit: Payer: Self-pay | Admitting: Neurology

## 2021-02-02 MED ORDER — ZOLPIDEM TARTRATE 5 MG PO TABS: ORAL_TABLET | ORAL | 0 refills | Status: DC

## 2021-02-02 NOTE — Addendum Note (Signed)
Addended by: Cameron Sprang on: 02/02/2021 03:05 PM   Modules accepted: Orders

## 2021-02-02 NOTE — Telephone Encounter (Signed)
Pt called to see what the script said, no answer voice mail left to call the office back

## 2021-02-02 NOTE — Telephone Encounter (Signed)
Patient called in and left a message stating the prescription refill that was sent to [facility] in [facility] for Ambien did not have an order with it.

## 2021-02-02 NOTE — Telephone Encounter (Signed)
Pt stated that pharmacy did not get the refill request on 01/20/21 with other refills

## 2021-02-02 NOTE — Telephone Encounter (Signed)
Patient called in and left message with Access Nurse returning Heather's call

## 2021-06-21 ENCOUNTER — Telehealth: Payer: Self-pay | Admitting: Neurology

## 2021-06-25 ENCOUNTER — Other Ambulatory Visit: Payer: Self-pay

## 2021-06-25 MED ORDER — ZOLPIDEM TARTRATE 5 MG PO TABS: ORAL_TABLET | ORAL | 0 refills | Status: DC

## 2021-06-25 NOTE — Telephone Encounter (Signed)
Patient called stating she is having problems getting her Ambien 5 MG refills.  She'd like it in 90 days increment  [facility]

## 2021-06-25 NOTE — Telephone Encounter (Signed)
Refill [facility] [place]

## 2021-10-22 ENCOUNTER — Other Ambulatory Visit: Payer: Self-pay | Admitting: Neurology

## 2022-01-20 ENCOUNTER — Ambulatory Visit: Payer: Medicare Other | Admitting: Neurology

## 2022-03-12 ENCOUNTER — Other Ambulatory Visit: Payer: Self-pay | Admitting: Neurology
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
## 2022-04-20 ENCOUNTER — Ambulatory Visit (INDEPENDENT_AMBULATORY_CARE_PROVIDER_SITE_OTHER): Payer: Medicare Other | Admitting: Neurology

## 2022-04-20 ENCOUNTER — Encounter: Payer: Self-pay | Admitting: Neurology

## 2022-04-20 VITALS — BP 117/76 | HR 63 | Ht 66.0 in | Wt 187.6 lb

## 2022-04-20 DIAGNOSIS — M5412 Radiculopathy, cervical region: Secondary | ICD-10-CM | POA: Diagnosis not present

## 2022-04-20 DIAGNOSIS — M792 Neuralgia and neuritis, unspecified: Secondary | ICD-10-CM

## 2022-04-20 DIAGNOSIS — G959 Disease of spinal cord, unspecified: Secondary | ICD-10-CM

## 2022-04-20 DIAGNOSIS — M5416 Radiculopathy, lumbar region: Secondary | ICD-10-CM | POA: Diagnosis not present

## 2022-04-20 DIAGNOSIS — G894 Chronic pain syndrome: Secondary | ICD-10-CM | POA: Diagnosis not present

## 2022-04-20 MED ORDER — ZOLPIDEM TARTRATE 5 MG PO TABS: ORAL_TABLET | ORAL | 0 refills | Status: DC

## 2022-04-20 MED ORDER — BACLOFEN 10 MG PO TABS: 10.0000 mg | ORAL_TABLET | Freq: Every day | ORAL | 3 refills | Status: DC

## 2022-04-20 MED ORDER — GABAPENTIN 100 MG PO CAPS: ORAL_CAPSULE | ORAL | 3 refills | Status: DC

## 2022-09-06 ENCOUNTER — Other Ambulatory Visit: Payer: Self-pay | Admitting: Neurology

## 2023-01-14 ENCOUNTER — Encounter: Payer: Self-pay | Admitting: Neurology

## 2023-01-14 ENCOUNTER — Other Ambulatory Visit: Payer: Self-pay | Admitting: Neurology

## 2023-01-14 MED ORDER — GABAPENTIN 100 MG PO CAPS: ORAL_CAPSULE | ORAL | 1 refills | Status: DC

## 2023-01-14 MED ORDER — BACLOFEN 10 MG PO TABS: 10.0000 mg | ORAL_TABLET | Freq: Every day | ORAL | 1 refills | Status: DC

## 2023-01-14 MED ORDER — ZOLPIDEM TARTRATE 5 MG PO TABS: ORAL_TABLET | ORAL | 1 refills | Status: DC
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
## 2023-04-26 ENCOUNTER — Encounter: Payer: Self-pay | Admitting: Neurology

## 2023-04-26 ENCOUNTER — Ambulatory Visit (INDEPENDENT_AMBULATORY_CARE_PROVIDER_SITE_OTHER): Payer: Medicare Other | Admitting: Neurology

## 2023-04-26 VITALS — BP 122/76 | HR 58 | Ht 66.0 in | Wt 189.6 lb

## 2023-04-26 DIAGNOSIS — G959 Disease of spinal cord, unspecified: Secondary | ICD-10-CM

## 2023-04-26 DIAGNOSIS — M5412 Radiculopathy, cervical region: Secondary | ICD-10-CM | POA: Diagnosis not present

## 2023-04-26 DIAGNOSIS — R200 Anesthesia of skin: Secondary | ICD-10-CM

## 2023-04-26 DIAGNOSIS — M5416 Radiculopathy, lumbar region: Secondary | ICD-10-CM

## 2023-04-26 DIAGNOSIS — M792 Neuralgia and neuritis, unspecified: Secondary | ICD-10-CM

## 2023-04-26 MED ORDER — ZOLPIDEM TARTRATE 5 MG PO TABS: ORAL_TABLET | ORAL | 1 refills | Status: DC

## 2023-04-26 MED ORDER — GABAPENTIN 100 MG PO CAPS: ORAL_CAPSULE | ORAL | 1 refills | Status: DC

## 2023-04-26 MED ORDER — BACLOFEN 10 MG PO TABS: 10.0000 mg | ORAL_TABLET | Freq: Every day | ORAL | 1 refills | Status: DC

## 2023-04-27 ENCOUNTER — Encounter: Payer: Self-pay | Admitting: Neurology

## 2023-08-20 LAB — COLOGUARD: COLOGUARD: NEGATIVE
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
## 2024-04-25 ENCOUNTER — Ambulatory Visit (INDEPENDENT_AMBULATORY_CARE_PROVIDER_SITE_OTHER): Payer: Medicare Other | Admitting: Neurology

## 2024-04-25 ENCOUNTER — Encounter: Payer: Self-pay | Admitting: Neurology

## 2024-04-25 VITALS — BP 124/84 | HR 64 | Ht 66.0 in | Wt 192.0 lb

## 2024-04-25 DIAGNOSIS — G959 Disease of spinal cord, unspecified: Secondary | ICD-10-CM

## 2024-04-25 DIAGNOSIS — R202 Paresthesia of skin: Secondary | ICD-10-CM | POA: Diagnosis not present

## 2024-04-25 DIAGNOSIS — M5412 Radiculopathy, cervical region: Secondary | ICD-10-CM

## 2024-04-25 DIAGNOSIS — M5416 Radiculopathy, lumbar region: Secondary | ICD-10-CM

## 2024-04-25 MED ORDER — BACLOFEN 10 MG PO TABS: 10.0000 mg | ORAL_TABLET | Freq: Every day | ORAL | 1 refills | Status: AC

## 2024-04-25 MED ORDER — ZOLPIDEM TARTRATE 5 MG PO TABS: ORAL_TABLET | ORAL | 1 refills | Status: DC

## 2024-04-25 MED ORDER — GABAPENTIN 100 MG PO CAPS: ORAL_CAPSULE | ORAL | 1 refills | Status: AC

## 2024-05-03 ENCOUNTER — Telehealth: Payer: Self-pay

## 2024-05-03 NOTE — Telephone Encounter (Signed)
 Message left by Almarie at [facility], Patient address is a P.O Box and they can not fill a control medication with it.   Please change it.   LMOVM for patient with this information.

## 2024-05-04 ENCOUNTER — Other Ambulatory Visit: Payer: Self-pay | Admitting: Neurology

## 2024-05-04 MED ORDER — ZOLPIDEM TARTRATE 5 MG PO TABS: ORAL_TABLET | ORAL | 0 refills | Status: DC

## 2024-05-04 NOTE — Telephone Encounter (Signed)
 Pt. Cld afterhours, adrs added to Temporary address [address] [place], [place], [place] [address]

## 2024-05-04 NOTE — Telephone Encounter (Signed)
 Ambien  sent to [facility] in [facility]

## 2024-08-02 ENCOUNTER — Encounter: Payer: Self-pay | Admitting: Neurology

## 2024-08-03 ENCOUNTER — Other Ambulatory Visit: Payer: Self-pay | Admitting: Neurology

## 2024-08-03 MED ORDER — ZOLPIDEM TARTRATE 5 MG PO TABS: ORAL_TABLET | ORAL | 0 refills | Status: AC

## 2025-04-25 ENCOUNTER — Ambulatory Visit: Admitting: Neurology
